# Patient Record
Sex: Male | Born: 1950 | Race: Black or African American | Hispanic: No | Marital: Single | Smoking: Current every day smoker
Health system: Southern US, Community
[De-identification: ages and names within clinical notes are randomized; demographics above are authoritative.]

## PROBLEM LIST (undated history)

## (undated) DIAGNOSIS — M1990 Unspecified osteoarthritis, unspecified site: Secondary | ICD-10-CM

## (undated) DIAGNOSIS — K635 Polyp of colon: Secondary | ICD-10-CM

## (undated) DIAGNOSIS — I10 Essential (primary) hypertension: Secondary | ICD-10-CM

## (undated) DIAGNOSIS — G8929 Other chronic pain: Secondary | ICD-10-CM

## (undated) DIAGNOSIS — E669 Obesity, unspecified: Secondary | ICD-10-CM

## (undated) DIAGNOSIS — Z8619 Personal history of other infectious and parasitic diseases: Secondary | ICD-10-CM

## (undated) DIAGNOSIS — F419 Anxiety disorder, unspecified: Secondary | ICD-10-CM

## (undated) DIAGNOSIS — Z8719 Personal history of other diseases of the digestive system: Secondary | ICD-10-CM

## (undated) DIAGNOSIS — F319 Bipolar disorder, unspecified: Secondary | ICD-10-CM

## (undated) DIAGNOSIS — N189 Chronic kidney disease, unspecified: Secondary | ICD-10-CM

## (undated) DIAGNOSIS — N419 Inflammatory disease of prostate, unspecified: Secondary | ICD-10-CM

## (undated) DIAGNOSIS — F339 Major depressive disorder, recurrent, unspecified: Secondary | ICD-10-CM

## (undated) DIAGNOSIS — F329 Major depressive disorder, single episode, unspecified: Secondary | ICD-10-CM

## (undated) DIAGNOSIS — N183 Chronic kidney disease, stage 3 unspecified: Secondary | ICD-10-CM

## (undated) DIAGNOSIS — N401 Enlarged prostate with lower urinary tract symptoms: Secondary | ICD-10-CM

## (undated) DIAGNOSIS — K219 Gastro-esophageal reflux disease without esophagitis: Secondary | ICD-10-CM

## (undated) DIAGNOSIS — R12 Heartburn: Secondary | ICD-10-CM

## (undated) HISTORY — DX: Bipolar disorder, unspecified: F31.9

## (undated) HISTORY — PX: APPENDECTOMY: SHX54

## (undated) HISTORY — DX: Inflammatory disease of prostate, unspecified: N41.9

## (undated) HISTORY — DX: Chronic kidney disease, stage 3 unspecified: N18.30

## (undated) HISTORY — PX: TRIGGER FINGER RELEASE: SHX641

## (undated) HISTORY — DX: Anxiety disorder, unspecified: F41.9

## (undated) HISTORY — DX: Chronic kidney disease, unspecified: N18.9

## (undated) HISTORY — DX: Unspecified osteoarthritis, unspecified site: M19.90

## (undated) HISTORY — PX: CHOLECYSTECTOMY: SHX55

## (undated) HISTORY — DX: Personal history of other infectious and parasitic diseases: Z86.19

## (undated) HISTORY — DX: Polyp of colon: K63.5

## (undated) HISTORY — DX: Other chronic pain: G89.29

## (undated) HISTORY — DX: Obesity, unspecified: E66.9

## (undated) HISTORY — DX: Major depressive disorder, single episode, unspecified: F32.9

## (undated) HISTORY — DX: Personal history of other diseases of the digestive system: Z87.19

## (undated) HISTORY — DX: Major depressive disorder, recurrent, unspecified: F33.9

## (undated) HISTORY — PX: OTHER SURGICAL HISTORY: SHX169

## (undated) HISTORY — DX: Essential (primary) hypertension: I10

## (undated) HISTORY — DX: Heartburn: R12

## (undated) HISTORY — DX: Benign prostatic hyperplasia with lower urinary tract symptoms: N40.1

## (undated) HISTORY — DX: Gastro-esophageal reflux disease without esophagitis: K21.9

---

## 2008-11-24 ENCOUNTER — Emergency Department: Payer: Self-pay | Admitting: Internal Medicine

## 2010-11-25 DIAGNOSIS — F17200 Nicotine dependence, unspecified, uncomplicated: Secondary | ICD-10-CM | POA: Insufficient documentation

## 2010-11-25 DIAGNOSIS — Z720 Tobacco use: Secondary | ICD-10-CM | POA: Insufficient documentation

## 2010-11-25 DIAGNOSIS — K219 Gastro-esophageal reflux disease without esophagitis: Secondary | ICD-10-CM | POA: Insufficient documentation

## 2010-11-25 DIAGNOSIS — I10 Essential (primary) hypertension: Secondary | ICD-10-CM | POA: Insufficient documentation

## 2010-11-25 DIAGNOSIS — F411 Generalized anxiety disorder: Secondary | ICD-10-CM | POA: Insufficient documentation

## 2010-11-25 DIAGNOSIS — N400 Enlarged prostate without lower urinary tract symptoms: Secondary | ICD-10-CM | POA: Insufficient documentation

## 2010-11-25 DIAGNOSIS — F329 Major depressive disorder, single episode, unspecified: Secondary | ICD-10-CM | POA: Insufficient documentation

## 2010-11-25 DIAGNOSIS — M1990 Unspecified osteoarthritis, unspecified site: Secondary | ICD-10-CM | POA: Insufficient documentation

## 2011-04-16 DIAGNOSIS — N289 Disorder of kidney and ureter, unspecified: Secondary | ICD-10-CM | POA: Insufficient documentation

## 2011-10-29 ENCOUNTER — Ambulatory Visit: Payer: Self-pay | Admitting: General Surgery

## 2011-11-25 ENCOUNTER — Ambulatory Visit: Payer: Self-pay | Admitting: General Surgery

## 2011-11-26 LAB — PATHOLOGY REPORT

## 2012-04-14 DIAGNOSIS — F319 Bipolar disorder, unspecified: Secondary | ICD-10-CM | POA: Insufficient documentation

## 2012-05-09 DIAGNOSIS — M549 Dorsalgia, unspecified: Secondary | ICD-10-CM | POA: Insufficient documentation

## 2012-05-09 DIAGNOSIS — L84 Corns and callosities: Secondary | ICD-10-CM | POA: Insufficient documentation

## 2012-07-18 DIAGNOSIS — E876 Hypokalemia: Secondary | ICD-10-CM | POA: Insufficient documentation

## 2012-09-23 DIAGNOSIS — R892 Abnormal level of other drugs, medicaments and biological substances in specimens from other organs, systems and tissues: Secondary | ICD-10-CM | POA: Insufficient documentation

## 2012-12-28 ENCOUNTER — Ambulatory Visit: Payer: Self-pay | Admitting: Pain Medicine

## 2013-01-16 ENCOUNTER — Ambulatory Visit: Payer: Self-pay | Admitting: Surgery

## 2013-01-23 ENCOUNTER — Ambulatory Visit: Payer: Self-pay | Admitting: Surgery

## 2013-01-24 LAB — PATHOLOGY REPORT

## 2013-05-30 ENCOUNTER — Ambulatory Visit: Payer: Self-pay | Admitting: Gastroenterology

## 2013-06-14 DIAGNOSIS — M7918 Myalgia, other site: Secondary | ICD-10-CM | POA: Insufficient documentation

## 2013-06-14 DIAGNOSIS — M47816 Spondylosis without myelopathy or radiculopathy, lumbar region: Secondary | ICD-10-CM | POA: Insufficient documentation

## 2013-07-17 DIAGNOSIS — K2270 Barrett's esophagus without dysplasia: Secondary | ICD-10-CM | POA: Insufficient documentation

## 2013-07-24 DIAGNOSIS — M549 Dorsalgia, unspecified: Secondary | ICD-10-CM

## 2013-07-24 DIAGNOSIS — G8929 Other chronic pain: Secondary | ICD-10-CM | POA: Insufficient documentation

## 2013-08-14 ENCOUNTER — Ambulatory Visit: Payer: Self-pay | Admitting: Physician Assistant

## 2013-09-18 DIAGNOSIS — R1084 Generalized abdominal pain: Secondary | ICD-10-CM | POA: Insufficient documentation

## 2013-10-07 ENCOUNTER — Ambulatory Visit: Payer: Self-pay | Admitting: Gastroenterology

## 2013-10-13 ENCOUNTER — Ambulatory Visit: Payer: Self-pay | Admitting: Gastroenterology

## 2013-10-16 ENCOUNTER — Ambulatory Visit: Payer: Self-pay | Admitting: Emergency Medicine

## 2013-11-19 ENCOUNTER — Emergency Department: Payer: Self-pay | Admitting: Emergency Medicine

## 2013-11-20 LAB — CBC
HCT: 42.8 % (ref 40.0–52.0)
HGB: 14.5 g/dL (ref 13.0–18.0)
MCH: 29.6 pg (ref 26.0–34.0)
MCHC: 33.8 g/dL (ref 32.0–36.0)
MCV: 88 fL (ref 80–100)
Platelet: 182 10*3/uL (ref 150–440)
RBC: 4.89 10*6/uL (ref 4.40–5.90)
RDW: 14 % (ref 11.5–14.5)
WBC: 10.2 10*3/uL (ref 3.8–10.6)

## 2013-11-20 LAB — BASIC METABOLIC PANEL
ANION GAP: 12 (ref 7–16)
BUN: 24 mg/dL — ABNORMAL HIGH (ref 7–18)
CALCIUM: 8.8 mg/dL (ref 8.5–10.1)
CO2: 25 mmol/L (ref 21–32)
Chloride: 103 mmol/L (ref 98–107)
Creatinine: 2.15 mg/dL — ABNORMAL HIGH (ref 0.60–1.30)
EGFR (Non-African Amer.): 32 — ABNORMAL LOW
GFR CALC AF AMER: 37 — AB
Glucose: 140 mg/dL — ABNORMAL HIGH (ref 65–99)
Osmolality: 286 (ref 275–301)
Potassium: 3.1 mmol/L — ABNORMAL LOW (ref 3.5–5.1)
SODIUM: 140 mmol/L (ref 136–145)

## 2013-11-20 LAB — TROPONIN I: Troponin-I: <0.02 ng/mL

## 2014-01-18 DIAGNOSIS — N401 Enlarged prostate with lower urinary tract symptoms: Secondary | ICD-10-CM

## 2014-01-18 DIAGNOSIS — N138 Other obstructive and reflux uropathy: Secondary | ICD-10-CM | POA: Insufficient documentation

## 2014-02-15 ENCOUNTER — Ambulatory Visit: Payer: Self-pay | Admitting: Emergency Medicine

## 2014-03-21 DIAGNOSIS — R768 Other specified abnormal immunological findings in serum: Secondary | ICD-10-CM | POA: Insufficient documentation

## 2014-03-27 ENCOUNTER — Ambulatory Visit: Payer: Self-pay | Admitting: Family Medicine

## 2014-04-19 ENCOUNTER — Ambulatory Visit: Payer: Self-pay | Admitting: Gastroenterology

## 2014-08-24 NOTE — Op Note (Signed)
PATIENT NAME:  [name] Gray, [name] MR#:  [id number] DATE OF BIRTH:  07-27-1950  DATE OF PROCEDURE:  01/23/2013  PREOPERATIVE DIAGNOSIS:  Chronic cholecystitis, cholelithiasis.   POSTOPERATIVE DIAGNOSIS:  Chronic cholecystitis, cholelithiasis.   PROCEDURE: Laparoscopic cholecystectomy, cholangiogram.   SURGEON: [name] Gray, M.D.   ANESTHESIA: General.   INDICATIONS: This 64 year old male has a history of epigastric pains, ultrasound findings of solitary gallstone and surgery was recommended for definitive treatment.   The patient was placed on the operating table in the supine position under general endotracheal anesthesia. The abdomen was prepared with ChloraPrep, and draped in a sterile manner.   A short incision was made in the inferior aspect of the umbilicus and carried down to the deep fascia which was grasped with laryngeal hook and elevated. A Veress needle was inserted, aspirated and irrigated with a saline solution. Next, the peritoneal cavity was inflated with carbon dioxide. The Veress needle was removed. The 10 mm cannula was inserted. The 10 mm 0-degree laparoscope was inserted to view the peritoneal cavity. Another incision was made in the epigastrium slightly to the right of the midline to introduce an 11 mm cannula. Two incisions were made in the lateral aspect of the right upper quadrant to introduce two 5 mm cannulas.   The gallbladder was found to have multiple adhesions around it. These multiple adhesions were taken down with blunt and sharp dissection. Several tiny bleeding points were electrocauterized. The gallbladder was retracted towards the right shoulder. The liver appeared normal. The porta hepatis was demonstrated. Additional adhesions were dissected. The cystic duct was dissected free from surrounding structures. The gallbladder was further mobilized with incision of the visceral peritoneum and dissection of adhesions. The cystic artery was dissected free from surrounding  structures. A critical view of safety was demonstrated. There was a small branch of the cystic artery which was controlled with endoclip and divided. This allowed better traction on the cystic duct. An endoclip was placed across the cystic duct adjacent to the neck of the gallbladder. An incision was made in the cystic duct to introduce a Reddick catheter. Half-strength Conray-60 dye was injected as the cholangiogram was done with fluoroscopy viewing the biliary tree and prompt flow of dye into the duodenum. No retained stones were seen. The Reddick catheter was removed. The cystic duct was doubly ligated with endoclips and divided. The cystic artery was controlled with endoclips and divided. The gallbladder was dissected free from the liver with blunt and sharp dissection. Several small bleeding points were cauterized. The site was irrigated with heparinized saline solution and aspirated. Hemostasis was intact. The gallbladder was delivered up through the infraumbilical incision, opened and suctioned. There was a large, hard, smooth stone and some white bile which was aspirated. The skin incision was lengthened by approximately 8 mm and the fascial incision was lengthened by some 8 mm, and with additional traction, the gallbladder with the stone was removed and submitted in formalin for routine pathology. The right upper quadrant was further inspected. Hemostasis was intact. The cannulas were removed. Carbon dioxide was allowed to escape from the peritoneal cavity. The fascial defect at the umbilicus was closed with 0 Vicryl figure-of-eight suture. Skin incisions were closed with interrupted 5-0 chromic subcuticular sutures, benzoin and Steri-Strips. Dressings were applied with paper tape. The patient tolerated surgery satisfactorily and was then prepared for transfer to the recovery room.     ____________________________ Shela CommonsJ. [name] RollsWilton Smith, MD jws:dmm D: 01/23/2013 09:18:16 ET T: 01/23/2013 09:43:40  ET JOB#: [id number]  cc: Adella Hare, MD, <Dictator> Adella Hare MD ELECTRONICALLY SIGNED 01/24/2013 9:01

## 2014-08-27 LAB — SURGICAL PATHOLOGY

## 2014-12-17 ENCOUNTER — Other Ambulatory Visit: Payer: Self-pay | Admitting: Family Medicine

## 2014-12-18 ENCOUNTER — Other Ambulatory Visit: Payer: Self-pay | Admitting: Family Medicine

## 2014-12-18 DIAGNOSIS — R1031 Right lower quadrant pain: Secondary | ICD-10-CM

## 2014-12-18 DIAGNOSIS — K4090 Unilateral inguinal hernia, without obstruction or gangrene, not specified as recurrent: Secondary | ICD-10-CM

## 2014-12-21 ENCOUNTER — Ambulatory Visit
Admission: RE | Admit: 2014-12-21 | Discharge: 2014-12-21 | Disposition: A | Discharge status: Home / Self Care | Payer: Medicare Other | Source: Ambulatory Visit | Attending: Family Medicine | Admitting: Family Medicine

## 2014-12-21 ENCOUNTER — Ambulatory Visit: Admission: RE | Admit: 2014-12-21 | Payer: Medicare Other | Source: Ambulatory Visit

## 2014-12-21 DIAGNOSIS — K4090 Unilateral inguinal hernia, without obstruction or gangrene, not specified as recurrent: Secondary | ICD-10-CM | POA: Insufficient documentation

## 2014-12-21 DIAGNOSIS — R1031 Right lower quadrant pain: Secondary | ICD-10-CM

## 2015-04-18 ENCOUNTER — Ambulatory Visit: Payer: Self-pay | Admitting: Urology

## 2015-04-24 ENCOUNTER — Ambulatory Visit (INDEPENDENT_AMBULATORY_CARE_PROVIDER_SITE_OTHER): Payer: Medicare Other | Admitting: Urology

## 2015-04-24 ENCOUNTER — Encounter: Payer: Self-pay | Admitting: *Deleted

## 2015-04-24 ENCOUNTER — Encounter: Payer: Self-pay | Admitting: Urology

## 2015-04-24 VITALS — BP 126/75 | HR 67 | Ht 69.0 in | Wt 217.3 lb

## 2015-04-24 DIAGNOSIS — N401 Enlarged prostate with lower urinary tract symptoms: Secondary | ICD-10-CM | POA: Diagnosis not present

## 2015-04-24 DIAGNOSIS — Z87448 Personal history of other diseases of urinary system: Secondary | ICD-10-CM | POA: Diagnosis not present

## 2015-04-24 DIAGNOSIS — Z87898 Personal history of other specified conditions: Secondary | ICD-10-CM | POA: Insufficient documentation

## 2015-04-24 DIAGNOSIS — N138 Other obstructive and reflux uropathy: Secondary | ICD-10-CM

## 2015-04-24 LAB — URINALYSIS, COMPLETE
Bilirubin, UA: NEGATIVE
GLUCOSE, UA: NEGATIVE
Ketones, UA: NEGATIVE
NITRITE UA: NEGATIVE
PH UA: 5.5 (ref 5.0–7.5)
Specific Gravity, UA: 1.015 (ref 1.005–1.030)
UUROB: 0.2 mg/dL (ref 0.2–1.0)

## 2015-04-24 LAB — MICROSCOPIC EXAMINATION: RBC MICROSCOPIC, UA: NONE SEEN /HPF (ref 0–?)

## 2015-04-24 LAB — BLADDER SCAN AMB NON-IMAGING: SCAN RESULT: 44

## 2015-04-24 MED ORDER — DOXAZOSIN MESYLATE 4 MG PO TABS: 4.0000 mg | ORAL_TABLET | Freq: Every day | ORAL | Status: DC

## 2015-04-24 NOTE — Progress Notes (Signed)
04/24/2015 2:44 PM   [name] Gray Dec 17, 1950 [id number]  Referring provider: Rayetta Humphrey, MD [address] [place], [place] [address]  Chief Complaint  Patient presents with  . Prostatitis    referred by [facility] Dr. Greggory Stallion    HPI: Patient is a 64 year old African-American male with a history of prostatitis who is referred by his primary care physician, Dr. Greggory Stallion, in anticipation for a hernia surgery.   Patient has a history of post anesthesia urinary retention.  His IPSS score today is 29, which is severe lower urinary tract symptomatology. He feels terrible with his quality life due to his urinary symptoms. His PVR is 44 mL.  His major complaint today is urinary frequency, urgency, dysuria, nocturia, post-void dribbling, intermittency, hesitancy and straining to urinate.    He has had these symptoms for over two years.  He denies any dysuria, hematuria or suprapubic pain.   He had been on tamsulosin in the past and he believes it helped his urinary symptoms.  He also denies any recent fevers, chills, nausea or vomiting.   He does not have a family history of PCa.      IPSS      04/24/15 1400       International Prostate Symptom Score   How often have you had the sensation of not emptying your bladder? More than half the time     How often have you had to urinate less than every two hours? More than half the time     How often have you found you stopped and started again several times when you urinated? About half the time     How often have you found it difficult to postpone urination? Almost always     How often have you had a weak urinary stream? Almost always     How often have you had to strain to start urination? More than half the time     How many times did you typically get up at night to urinate? 4 Times     Total IPSS Score 29     Quality of Life due to urinary symptoms   If you were to spend the rest of your life with your urinary condition  just the way it is now how would you feel about that? Terrible        Score:  1-7 Mild 8-19 Moderate 20-35 Severe  He has not experienced gross hematuria or suprapubic pain. He is not having fevers, chills, nausea or vomiting. He has not seen a urologist in the past he has not had previous PSA's.  He is scheduled for herniorrhaphy in the future, but he has a history of postoperative urinary retention.     PMH: Past Medical History  Diagnosis Date  . Chronic kidney disease   . Prostatitis   . HTN (hypertension)   . Heart burn   . Depression     Surgical History: Past Surgical History  Procedure Laterality Date  . Appendectomy    . Gall bladdder      Home Medications:    Medication List       This list is accurate as of: 04/24/15  2:44 PM.  Always use your most recent med list.               amLODipine 10 MG tablet  Commonly known as:  NORVASC  Take 10 mg by mouth.     citalopram 40 MG tablet  Commonly known as:  CELEXA  Take 40 mg by mouth.     doxazosin 4 MG tablet  Commonly known as:  CARDURA  Take 1 tablet (4 mg total) by mouth daily.     hydrochlorothiazide 25 MG tablet  Commonly known as:  HYDRODIURIL  Take 25 mg by mouth.     lamoTRIgine 100 MG tablet  Commonly known as:  LAMICTAL  Take 100 mg by mouth.     lisinopril 40 MG tablet  Commonly known as:  PRINIVIL,ZESTRIL  Take 40 mg by mouth.     metoprolol 50 MG tablet  Commonly known as:  LOPRESSOR  Take by mouth.     MULTI-VITAMINS Tabs  Take by mouth.     oxyCODONE 5 MG immediate release tablet  Commonly known as:  Oxy IR/ROXICODONE  Take by mouth.        Allergies:  Allergies  Allergen Reactions  . Antihistamines, Chlorpheniramine-Type     Cannot take due to chronic kidney disease    Family History: Family History  Problem Relation Age of Onset  . Kidney disease Neg Hx   . Prostate cancer Neg Hx     Social History:  reports that he has been smoking.  He does not have  any smokeless tobacco history on file. He reports that he does not drink alcohol or use illicit drugs.  ROS: UROLOGY Frequent Urination?: Yes Hard to postpone urination?: Yes Burning/pain with urination?: Yes Get up at night to urinate?: Yes Leakage of urine?: Yes Urine stream starts and stops?: Yes Trouble starting stream?: Yes Do you have to strain to urinate?: Yes Blood in urine?: No Urinary tract infection?: No Sexually transmitted disease?: No Injury to kidneys or bladder?: No Painful intercourse?: No Weak stream?: No Erection problems?: Yes Penile pain?: No  Gastrointestinal Nausea?: No Vomiting?: No Indigestion/heartburn?: No Diarrhea?: Yes Constipation?: Yes  Constitutional Fever: No Night sweats?: No Weight loss?: No Fatigue?: No  Skin Skin rash/lesions?: No Itching?: No  Eyes Blurred vision?: No Double vision?: No  Ears/Nose/Throat Sore throat?: No Sinus problems?: No  Hematologic/Lymphatic Swollen glands?: No Easy bruising?: No  Cardiovascular Leg swelling?: No Chest pain?: No  Respiratory Cough?: No Shortness of breath?: No  Endocrine Excessive thirst?: No  Musculoskeletal Back pain?: Yes Joint pain?: No  Neurological Headaches?: No Dizziness?: No  Psychologic Depression?: Yes Anxiety?: No  Physical Exam: BP 126/75 mmHg  Pulse 67  Ht 5\' 9"  (1.753 m)  Wt 217 lb 4.8 oz (98.567 kg)  BMI 32.08 kg/m2  Constitutional: Well nourished. Alert and oriented, No acute distress. HEENT: [place] AT, moist mucus membranes. Trachea midline, no masses. Cardiovascular: No clubbing, cyanosis, or edema. Respiratory: Normal respiratory effort, no increased work of breathing. GI: Abdomen is soft, non tender, non distended, no abdominal masses. Liver and spleen not palpable.  No hernias appreciated.  Stool sample for occult testing is not indicated.   GU: No CVA tenderness.  No bladder fullness or masses.  Patient with circumcise phallus.    Urethral meatus is patent.  No penile discharge. No penile lesions or rashes. Scrotum without lesions, cysts, rashes and/or edema.  Testicles are located scrotally bilaterally. No masses are appreciated in the testicles. Left and right epididymis are normal. Rectal: Patient with  normal sphincter tone. Anus and perineum without scarring or rashes. No rectal masses are appreciated. Prostate is approximately 45 grams, no nodules are appreciated. Seminal vesicles are normal. Skin: No rashes, bruises or suspicious lesions. Lymph: No cervical or inguinal adenopathy.  Neurologic: Grossly intact, no focal deficits, moving all 4 extremities. Psychiatric: Normal mood and affect.  Laboratory Data: Lab Results  Component Value Date   WBC 10.2 11/20/2013   HGB 14.5 11/20/2013   HCT 42.8 11/20/2013   MCV 88 11/20/2013   PLT 182 11/20/2013    Lab Results  Component Value Date   CREATININE 2.15* 11/20/2013    Urinalysis Results for orders placed or performed in visit on 04/24/15  Microscopic Examination  Result Value Ref Range   WBC, UA >30 (H) 0 -  5 /hpf   RBC, UA None seen 0 -  2 /hpf   Epithelial Cells (non renal) 0-10 0 - 10 /hpf   Mucus, UA Present (A) Not Estab.   Bacteria, UA Few (A) None seen/Few  PSA  Result Value Ref Range   Prostate Specific Ag, Serum 1.1 0.0 - 4.0 ng/mL  Urinalysis, Complete  Result Value Ref Range   Specific Gravity, UA 1.015 1.005 - 1.030   pH, UA 5.5 5.0 - 7.5   Color, UA Yellow Yellow   Appearance Ur Cloudy (A) Clear   Leukocytes, UA 2+ (A) Negative   Protein, UA 1+ (A) Negative/Trace   Glucose, UA Negative Negative   Ketones, UA Negative Negative   RBC, UA Trace (A) Negative   Bilirubin, UA Negative Negative   Urobilinogen, Ur 0.2 0.2 - 1.0 mg/dL   Nitrite, UA Negative Negative   Microscopic Examination See below:   BLADDER SCAN AMB NON-IMAGING  Result Value Ref Range   Scan Result 44     Pertinent Imaging: Results for MONTREAL, STEIDLE (MRN  [id number]) as of 04/24/2015 14:31  Ref. Range 04/24/2015 14:08  Scan Result Unknown 44    Assessment & Plan:    1. BPH with LUTS:   I PSS score is 29/6. His PVR is 44 mL.  He may have had success with an alpha-blocker in the past with his urinary symptoms. His insurance requires a prior authorization for tamsulosin, so I will prescribe doxazosin 4 mg to take at night. He'll follow-up in 2 weeks' time for I PSS score and PVR.  - PSA - Urinalysis, Complete - BLADDER SCAN AMB NON-IMAGING  2. History of postoperative urinary retention:   Patient's prostate was not large on exam and his PVR is minimal. His postoperative retention most likely is not due to his BPH but rather the anesthesia interacting with bladder function alone.  He will be started on a alpha-blocker in hopes to improve his chances of not going into urinary retention after his hernia surgery.  I did warn the patient that even if his urinary symptoms improve, he still may run the risk of postoperative retention requiring a catheter placed after surgery.   Return in about 2 weeks (around 05/08/2015) for PVR and IPSS score.  Michiel Cowboy, PA-C  [facility] [address] [place], [place] [address] [phone]

## 2015-04-25 ENCOUNTER — Telehealth: Payer: Self-pay

## 2015-04-25 LAB — PSA: Prostate Specific Ag, Serum: 1.1 ng/mL (ref 0.0–4.0)

## 2015-04-25 NOTE — Telephone Encounter (Signed)
Spoke with pt in reference to PSA. Pt voiced understanding.  

## 2015-04-25 NOTE — Telephone Encounter (Signed)
-----   Message from Harle BattiestShannon A McGowan, PA-C sent at 04/25/2015  8:27 AM EST ----- Patient's PSA is normal.  It should be repeated in one year.  He has a follow up appointment in 2 weeks.  We will see him then.

## 2015-05-10 ENCOUNTER — Ambulatory Visit: Payer: Medicare Other | Admitting: Urology

## 2015-05-10 ENCOUNTER — Encounter: Payer: Self-pay | Admitting: Urology

## 2015-06-06 ENCOUNTER — Encounter: Payer: Self-pay | Admitting: Urology

## 2015-06-06 ENCOUNTER — Ambulatory Visit: Payer: Medicare Other | Admitting: Urology

## 2016-01-13 ENCOUNTER — Emergency Department: Payer: Medicare Other

## 2016-01-13 ENCOUNTER — Emergency Department
Admission: EM | Admit: 2016-01-13 | Discharge: 2016-01-13 | Disposition: A | Discharge status: Home / Self Care | Payer: Medicare Other | Attending: Emergency Medicine | Admitting: Emergency Medicine

## 2016-01-13 DIAGNOSIS — N189 Chronic kidney disease, unspecified: Secondary | ICD-10-CM | POA: Insufficient documentation

## 2016-01-13 DIAGNOSIS — Z79899 Other long term (current) drug therapy: Secondary | ICD-10-CM | POA: Insufficient documentation

## 2016-01-13 DIAGNOSIS — M5442 Lumbago with sciatica, left side: Secondary | ICD-10-CM | POA: Diagnosis not present

## 2016-01-13 DIAGNOSIS — F17200 Nicotine dependence, unspecified, uncomplicated: Secondary | ICD-10-CM | POA: Diagnosis not present

## 2016-01-13 DIAGNOSIS — I129 Hypertensive chronic kidney disease with stage 1 through stage 4 chronic kidney disease, or unspecified chronic kidney disease: Secondary | ICD-10-CM | POA: Diagnosis not present

## 2016-01-13 DIAGNOSIS — M25552 Pain in left hip: Secondary | ICD-10-CM | POA: Diagnosis present

## 2016-01-13 DIAGNOSIS — M5432 Sciatica, left side: Secondary | ICD-10-CM

## 2016-01-13 MED ORDER — TRAMADOL HCL 50 MG PO TABS: 50.0000 mg | ORAL_TABLET | Freq: Four times a day (QID) | ORAL | 0 refills | Status: DC | PRN

## 2016-01-13 MED ORDER — MORPHINE SULFATE (PF) 4 MG/ML IV SOLN
4.0000 mg | Freq: Once | INTRAVENOUS | Status: AC
  Administered 2016-01-13: 4 mg via INTRAMUSCULAR
  Filled 2016-01-13: qty 1

## 2016-01-13 MED ORDER — ETODOLAC 200 MG PO CAPS: 200.0000 mg | ORAL_CAPSULE | Freq: Three times a day (TID) | ORAL | 0 refills | Status: DC

## 2016-01-13 MED ORDER — KETOROLAC TROMETHAMINE 30 MG/ML IJ SOLN
30.0000 mg | Freq: Once | INTRAMUSCULAR | Status: DC
  Filled 2016-01-13: qty 1

## 2016-01-13 MED ORDER — LIDOCAINE 5 % EX PTCH
1.0000 | MEDICATED_PATCH | CUTANEOUS | Status: DC
  Administered 2016-01-13: 1 via TRANSDERMAL
  Filled 2016-01-13: qty 1

## 2016-01-13 MED ORDER — LIDOCAINE 5 % EX PTCH: 1.0000 | MEDICATED_PATCH | Freq: Two times a day (BID) | CUTANEOUS | 0 refills | Status: DC

## 2016-01-13 MED ORDER — OXYCODONE-ACETAMINOPHEN 5-325 MG PO TABS
1.0000 | ORAL_TABLET | Freq: Once | ORAL | Status: AC
Start: 2016-01-13 — End: 2016-01-13
  Administered 2016-01-13: 1 via ORAL

## 2016-01-13 MED ORDER — KETOROLAC TROMETHAMINE 30 MG/ML IJ SOLN
30.0000 mg | Freq: Once | INTRAMUSCULAR | Status: AC
  Administered 2016-01-13: 30 mg via INTRAMUSCULAR

## 2016-01-13 MED ORDER — OXYCODONE-ACETAMINOPHEN 5-325 MG PO TABS
ORAL_TABLET | ORAL | Status: AC
  Administered 2016-01-13: 1 via ORAL
  Filled 2016-01-13: qty 1

## 2016-01-13 NOTE — ED Notes (Signed)
Reviewed d/c instructions, follow-up care and prescriptions with pt. Pt verbalized understanding 

## 2016-01-13 NOTE — ED Provider Notes (Signed)
[facility] Emergency Department Provider Note   ____________________________________________   First MD Initiated Contact with Patient 01/13/16 [phone]     (approximate)  I have reviewed the triage vital signs and the nursing notes.   HISTORY  Chief Complaint Hip Pain    HPI [name] Gray is a 65 y.o. male who comes into the hospital today with some back pain. The patient reports he fell out of the shower last week Wednesday and fell on his bottom. He reports he did up for a few years ago and was told he had sciatica. He reports the pain was initially a numb pain but it's been getting worse and worse. He reports he can't take the pain anymore. The pain is greater on the left more so than the right. It seems to go down the back of his thigh and he is having pain in his muscle. He reports that he got up here to use the bathroom and the pain seemed to be getting worse and shooting down his leg and into his lower back. The patient rates his pain an 8 out of 10 in intensity. He reports it is worse when he walks on it and he feels like he can't stand. He reports that he is not taking anything for pain at home because of the blood pressure. The patient was concerned so decided to come in to the hospital for evaluation.   Past Medical History:  Diagnosis Date  . Chronic kidney disease   . Depression   . Heart burn   . HTN (hypertension)   . Prostatitis     Patient Active Problem List   Diagnosis Date Noted  . BPH with obstruction/lower urinary tract symptoms 04/24/2015  . H/O urinary retention 04/24/2015  . 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 Benign prostatic hyperplasia with urinary obstruction 01/18/2014  . Abdominal pain, generalized 09/18/2013  . Back pain, chronic 07/24/2013  . Barrett esophagus 07/17/2013  . 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 Myofascial pain 06/14/2013  . Abnormal toxicological findings 09/23/2012  . Decreased  potassium in the blood 07/18/2012  . Back ache 05/09/2012  . Callus of foot 05/09/2012  . Affective bipolar disorder (HCC) 04/14/2012  . Bipolar affective disorder (HCC) 04/14/2012  . Impaired renal function 04/16/2011  . Anxiety state 11/25/2010  . Clinical depression 11/25/2010  . Acid reflux 11/25/2010  . Benign essential HTN 11/25/2010  . Benign prostatic hypertrophy without urinary obstruction 11/25/2010  . Arthritis, degenerative 11/25/2010  . Compulsive tobacco user syndrome 11/25/2010  . Current tobacco use 11/25/2010    Past Surgical History:  Procedure Laterality Date  . APPENDECTOMY    . gall bladdder      Prior to Admission medications   Medication Sig Start Date End Date Taking? Authorizing Provider  amLODipine (NORVASC) 10 MG tablet Take 10 mg by mouth.    Historical Provider, MD  citalopram (CELEXA) 40 MG tablet Take 40 mg by mouth.    Historical Provider, MD  doxazosin (CARDURA) 4 MG tablet Take 1 tablet (4 mg total) by mouth daily. 04/24/15   Harle Battiest, PA-C  etodolac (LODINE) 200 MG capsule Take 1 capsule (200 mg total) by mouth every 8 (eight) hours. 01/13/16   Rebecka Apley, MD  hydrochlorothiazide (HYDRODIURIL) 25 MG tablet Take 25 mg by mouth.    Historical Provider, MD  lamoTRIgine (LAMICTAL) 100 MG tablet Take 100 mg by mouth.    Historical Provider, MD  lidocaine (LIDODERM)  5 % Place 1 patch onto the skin every 12 (twelve) hours. Remove & Discard patch within 12 hours or as directed by MD 01/13/16 01/12/17  Rebecka Apley, MD  lisinopril (PRINIVIL,ZESTRIL) 40 MG tablet Take 40 mg by mouth.    Historical Provider, MD  metoprolol (LOPRESSOR) 50 MG tablet Take by mouth. 09/19/14 08/22/15  Historical Provider, MD  Multiple Vitamin (MULTI-VITAMINS) TABS Take by mouth.    Historical Provider, MD  oxyCODONE (OXY IR/ROXICODONE) 5 MG immediate release tablet Take by mouth. 05/24/14   Historical Provider, MD  traMADol (ULTRAM) 50 MG tablet Take 1 tablet (50  mg total) by mouth every 6 (six) hours as needed. 01/13/16   Rebecka Apley, MD    Allergies Antihistamines, chlorpheniramine-type  Family History  Problem Relation Age of Onset  . Kidney disease Neg Hx   . Prostate cancer Neg Hx     Social History Social History  Substance Use Topics  . Smoking status: Current Every Day Smoker  . Smokeless tobacco: Not on file  . Alcohol use No    Review of Systems Constitutional: No fever/chills Eyes: No visual changes. ENT: No sore throat. Cardiovascular: Denies chest pain. Respiratory: Denies shortness of breath. Gastrointestinal: No abdominal pain.  No nausea, no vomiting.  No diarrhea.  No constipation. Genitourinary: Negative for dysuria. Musculoskeletal:Low back pain and left SI joint pain Skin: Negative for rash. Neurological: Negative for headaches, focal weakness or numbness.  10-point ROS otherwise negative.  ____________________________________________   PHYSICAL EXAM:  VITAL SIGNS: ED Triage Vitals  Enc Vitals Group     BP 01/13/16 0009 (!) 155/95     Pulse Rate 01/13/16 0009 72     Resp 01/13/16 0009 (!) 22     Temp 01/13/16 0009 98.9 F (37.2 C)     Temp Source 01/13/16 0009 Oral     SpO2 01/13/16 0009 100 %     Weight 01/13/16 0010 210 lb (95.3 kg)     Height 01/13/16 0010 5\' 9"  (1.753 m)     Head Circumference --      Peak Flow --      Pain Score 01/13/16 0010 10     Pain Loc --      Pain Edu? --      Excl. in GC? --     Constitutional: Alert and oriented. Well appearing and in Moderate distress. Eyes: Conjunctivae are normal. PERRL. EOMI. Head: Atraumatic. Nose: No congestion/rhinnorhea. Mouth/Throat: Mucous membranes are moist.  Oropharynx non-erythematous. Cardiovascular: Normal rate, regular rhythm. Grossly normal heart sounds.  Good peripheral circulation. Respiratory: Normal respiratory effort.  No retractions. Lungs CTAB. Gastrointestinal: Soft and nontender. No distention. Positive bowel  sounds Musculoskeletal: Lower midline tenderness to palpation and left SI joint tenderness to palpation Neurologic:  Normal speech and language.  Skin:  Skin is warm, dry and intact.  Psychiatric: Mood and affect are normal.   ____________________________________________   LABS (all labs ordered are listed, but only abnormal results are displayed)  Labs Reviewed - No data to display ____________________________________________  EKG  None ____________________________________________  RADIOLOGY  Left hip x-ray with pelvis Pelvis CT ____________________________________________   PROCEDURES  Procedure(s) performed: None  Procedures  Critical Care performed: No  ____________________________________________   INITIAL IMPRESSION / ASSESSMENT AND PLAN / ED COURSE  Pertinent labs & imaging results that were available during my care of the patient were reviewed by me and considered in my medical decision making (see chart for details).  This is a 65 year old  male who comes into the hospital today after a fall almost a week ago with some pain to his left SI joint going down his leg. The patient reports that he has not been taking anything for pain but it seems to be worse when he stands or walks. The patient's x-ray was performed. I will give the patient a shot of morphine as well as Toradol and I will send him for a CT scan to evaluate for possible occult fracture. The patient will be reassessed once I received the results of his imaging studies.  Clinical Course  Value Comment By Time  DG Hip Unilat With Pelvis 2-3 Views Left No fracture or subluxation of the pelvis or left hip. Rebecka ApleyAllison P Delonna Ney, MD 09/11 [phone]  CT Pelvis Wo Contrast No significant abnormality. Rebecka ApleyAllison P Marcel Sorter, MD 09/11 0500    The patient's CT scan and x-ray are both unremarkable. The patient's pain is improved after the medication. I will give the patient some prescriptions for home and have her follow-up  with orthopedic surgery if he has any worsening or persistent pain. The patient will be discharged to home. ____________________________________________   FINAL CLINICAL IMPRESSION(S) / ED DIAGNOSES  Final diagnoses:  Left hip pain  Sciatica of left side  Hip pain, left      NEW MEDICATIONS STARTED DURING THIS VISIT:  New Prescriptions   ETODOLAC (LODINE) 200 MG CAPSULE    Take 1 capsule (200 mg total) by mouth every 8 (eight) hours.   LIDOCAINE (LIDODERM) 5 %    Place 1 patch onto the skin every 12 (twelve) hours. Remove & Discard patch within 12 hours or as directed by MD   TRAMADOL (ULTRAM) 50 MG TABLET    Take 1 tablet (50 mg total) by mouth every 6 (six) hours as needed.     Note:  This document was prepared using Dragon voice recognition software and may include unintentional dictation errors.    Rebecka ApleyAllison P Znya Albino, MD 01/13/16 [phone]

## 2016-01-13 NOTE — ED Notes (Signed)
Pt reports he fell last Wednesday. Pt c/o left hip/buttock pain radiating down leg. Pt reports hx of sciatica in same leg, however reports this pain is worse than normal

## 2016-01-13 NOTE — ED Notes (Signed)
Patient transported to CT 

## 2016-01-13 NOTE — ED Notes (Signed)
MD Webster at bedside 

## 2016-01-13 NOTE — ED Triage Notes (Signed)
Reports fell getting out of the shower a week ago.  Patient reports pain to left hip that radiates down into left leg.

## 2016-07-07 ENCOUNTER — Ambulatory Visit: Payer: Medicare Other | Admitting: Anesthesiology

## 2016-07-13 ENCOUNTER — Ambulatory Visit
Admission: EM | Admit: 2016-07-13 | Discharge: 2016-07-13 | Disposition: A | Discharge status: Home / Self Care | Payer: Medicare Other | Attending: Family Medicine | Admitting: Family Medicine

## 2016-07-13 DIAGNOSIS — S161XXA Strain of muscle, fascia and tendon at neck level, initial encounter: Secondary | ICD-10-CM | POA: Diagnosis not present

## 2016-07-13 DIAGNOSIS — H00011 Hordeolum externum right upper eyelid: Secondary | ICD-10-CM

## 2016-07-13 MED ORDER — ERYTHROMYCIN 5 MG/GM OP OINT: TOPICAL_OINTMENT | OPHTHALMIC | 0 refills | Status: DC

## 2016-07-13 MED ORDER — CARISOPRODOL 350 MG PO TABS: 350.0000 mg | ORAL_TABLET | Freq: Three times a day (TID) | ORAL | 0 refills | Status: DC

## 2016-07-13 NOTE — ED Provider Notes (Signed)
CSN: [id number]     Arrival date & time 07/13/16  1341 History   First MD Initiated Contact with Patient 07/13/16 1456     Chief Complaint  Patient presents with  . Conjunctivitis    right eye   (Consider location/radiation/quality/duration/timing/severity/associated sxs/prior Treatment) HPI  65 year old male who presents with a swollen right eye that has been losing white discharge for approximately 2 days. His eyes are matted in the mornings. The swelling is limited to his upper lid towards the lateral canthus. Vision changes. In addition his had a headache with some itching and tenderness to the touch. He does not wear contacts or glasses. He has noticed a decreased range of motion of his neck. Although his headache and pain seem to be all right sided. He has not noticed any rashes.        Past Medical History:  Diagnosis Date  . Chronic kidney disease   . Depression   . Heart burn   . HTN (hypertension)   . Prostatitis    Past Surgical History:  Procedure Laterality Date  . APPENDECTOMY    . gall bladdder     Family History  Problem Relation Age of Onset  . Kidney disease Neg Hx   . Prostate cancer Neg Hx    Social History  Substance Use Topics  . Smoking status: Current Every Day Smoker    Types: Cigarettes  . Smokeless tobacco: Never Used  . Alcohol use No    Review of Systems  Constitutional: Positive for activity change. Negative for chills, fatigue and fever.  Eyes: Positive for pain and discharge. Negative for photophobia, redness and visual disturbance.  Skin: Negative for rash.  All other systems reviewed and are negative.   Allergies  Antihistamines, chlorpheniramine-type  Home Medications   Prior to Admission medications   Medication Sig Start Date End Date Taking? Authorizing Provider  amLODipine (NORVASC) 10 MG tablet Take 10 mg by mouth.   Yes Historical Provider, MD  citalopram (CELEXA) 40 MG tablet Take 40 mg by mouth.   Yes Historical  Provider, MD  doxazosin (CARDURA) 4 MG tablet Take 1 tablet (4 mg total) by mouth daily. 04/24/15  Yes Shannon A McGowan, PA-C  etodolac (LODINE) 200 MG capsule Take 1 capsule (200 mg total) by mouth every 8 (eight) hours. 01/13/16  Yes Rebecka Apley, MD  hydrochlorothiazide (HYDRODIURIL) 25 MG tablet Take 25 mg by mouth.   Yes Historical Provider, MD  lamoTRIgine (LAMICTAL) 100 MG tablet Take 100 mg by mouth.   Yes Historical Provider, MD  lisinopril (PRINIVIL,ZESTRIL) 40 MG tablet Take 40 mg by mouth.   Yes Historical Provider, MD  Multiple Vitamin (MULTI-VITAMINS) TABS Take by mouth.   Yes Historical Provider, MD  carisoprodol (SOMA) 350 MG tablet Take 1 tablet (350 mg total) by mouth 3 (three) times daily. 07/13/16   Lutricia Feil, PA-C  erythromycin ophthalmic ointment Place a 1/2 inch ribbon of ointment into the lower eyelid. 07/13/16   Lutricia Feil, PA-C  metoprolol (LOPRESSOR) 50 MG tablet Take by mouth. 09/19/14 08/22/15  Historical Provider, MD   Meds Ordered and Administered this Visit  Medications - No data to display  BP (!) 147/89 (BP Location: Left Arm)   Pulse 61   Temp 98.7 F (37.1 C) (Oral)   Resp 18   Ht 5\' 10"  (1.778 m)   Wt 209 lb (94.8 kg)   SpO2 99%   BMI 29.99 kg/m  No data found.  Physical Exam  Constitutional: He is oriented to person, place, and time. He appears well-developed and well-nourished. No distress.  HENT:  Head: Normocephalic and atraumatic.  Right Ear: External ear normal.  Left Ear: External ear normal.  Nose: Nose normal.  Eyes: EOM are normal. Pupils are equal, round, and reactive to light. Right eye exhibits discharge. Left eye exhibits no discharge.  Exam nation the right eye shows a swelling of the right eyelid lateral aspect this is tender to the touch. EOMs are intact. Pupils are equal and reactive to light. Conjunctiva is clear. He does have a white discharge in the lateral canthus and stained over the skin immediate to the  lateral canthus. Examination of the head and scalp do not show any rashes present. Tender to palpation in the paraspinous muscles were there is palpable spasm on the right. The tissues are soft on the left. He does have a decreased range of motion particularly on rightward rotation and extension and flexion.  Neck:  Referred to eyes above  Pulmonary/Chest: Effort normal and breath sounds normal. No respiratory distress. He has no wheezes.  Musculoskeletal: Normal range of motion.  Lymphadenopathy:    He has no cervical adenopathy.  Neurological: He is alert and oriented to person, place, and time.  Skin: Skin is warm and dry. He is not diaphoretic.  Psychiatric: He has a normal mood and affect. His behavior is normal. Judgment and thought content normal.  Nursing note and vitals reviewed.   Urgent Care Course     Procedures (including critical care time)  Labs Review Labs Reviewed - No data to display  Imaging Review No results found.   Visual Acuity Review  Right Eye Distance:   Left Eye Distance:   Bilateral Distance:    Right Eye Near:   Left Eye Near:    Bilateral Near:         MDM   1. Hordeolum externum of right upper eyelid   2. Cervical myofascial strain, initial encounter    Discharge Medication List as of 07/13/2016  3:18 PM    START taking these medications   Details  carisoprodol (SOMA) 350 MG tablet Take 1 tablet (350 mg total) by mouth 3 (three) times daily., Starting Mon 07/13/2016, Print    erythromycin ophthalmic ointment Place a 1/2 inch ribbon of ointment into the lower eyelid., Normal      Plan: 1. Test/x-ray results and diagnosis reviewed with patient 2. rx as per orders; risks, benefits, potential side effects reviewed with patient 3. Recommend supportive treatment with Warm Compresses times daily followed by mild massage. Use erythromycin ointment as prescribed. Because of the limitations of his prescription plan I have given him Soma for  muscle muscle relaxer. Other Options were not covered by his plan. Other considerations of the differential could be possible early herpes zoster the present time is doubtful. He should follow-up with his primary care if he is not improving or if he worsens over the next couple of days. 4. F/u prn if symptoms worsen or don't improve     Lutricia FeilWilliam P Tanasia Budzinski, PA-C 07/13/16 1612

## 2016-07-13 NOTE — ED Triage Notes (Signed)
Pt c/o swollen right eye that is oozing white discharge for about 2 days. Denies any vision changes, but a little blurry. Headache and some itching and tender to the touch. No contacts or glasses

## 2017-03-01 ENCOUNTER — Encounter (INDEPENDENT_AMBULATORY_CARE_PROVIDER_SITE_OTHER): Payer: Self-pay | Admitting: Vascular Surgery

## 2017-11-16 IMAGING — CT CT PELVIS W/O CM
2 of 3 series · 16 of 46 positions shown, 18 images · non-contrast
Comparison: 10/07/2013

CLINICAL DATA: Left buttock pain after fall last week.

EXAM:
CT PELVIS WITHOUT CONTRAST
TECHNIQUE: Multidetector CT imaging of the pelvis was performed following the
standard protocol without intravenous contrast.

[Series 6: axial st · axial · 0.74mm/px · z∈[-391,-165]mm · 13 of 131 slices shown, 15 images]
[im 9/131  soft-tissue]
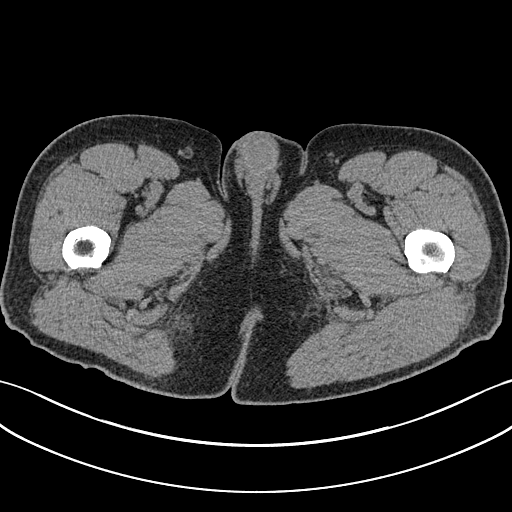
[im 9/131  bone]
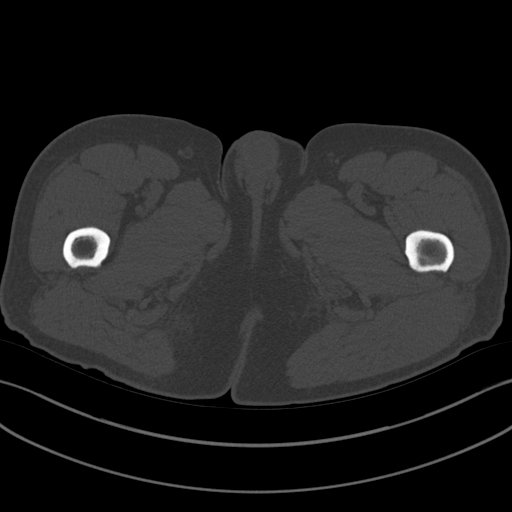
[im 17/131  soft-tissue]
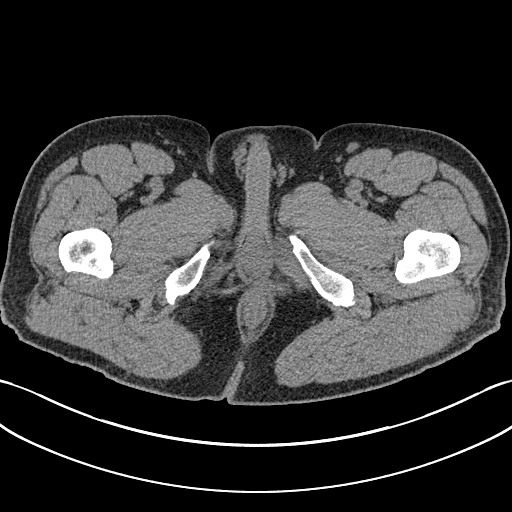
[im 26/131  soft-tissue]
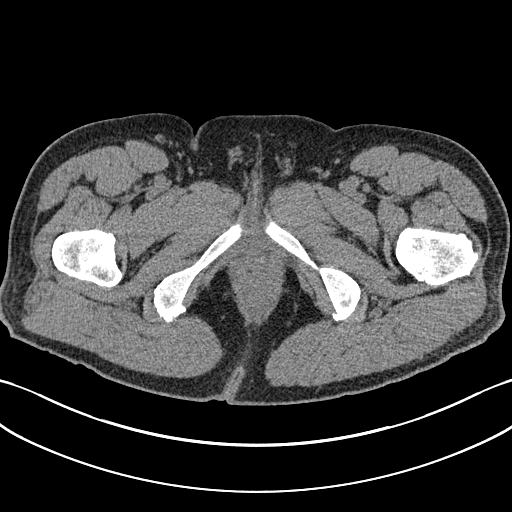
[im 38/131  soft-tissue]
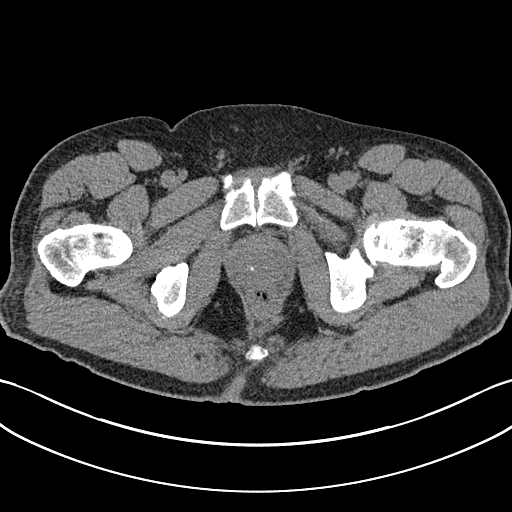
[im 47/131  soft-tissue]
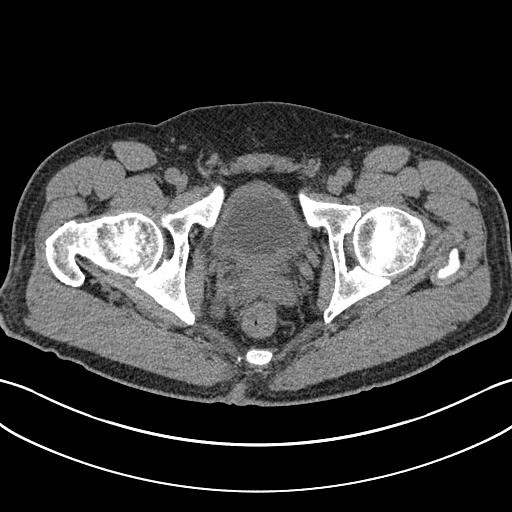
[im 55/131  soft-tissue]
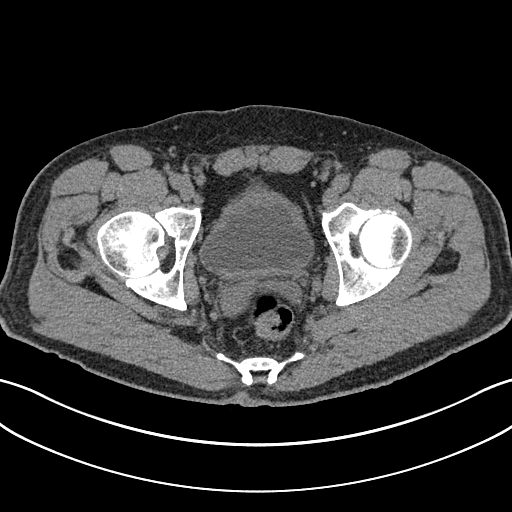
[im 68/131  soft-tissue]
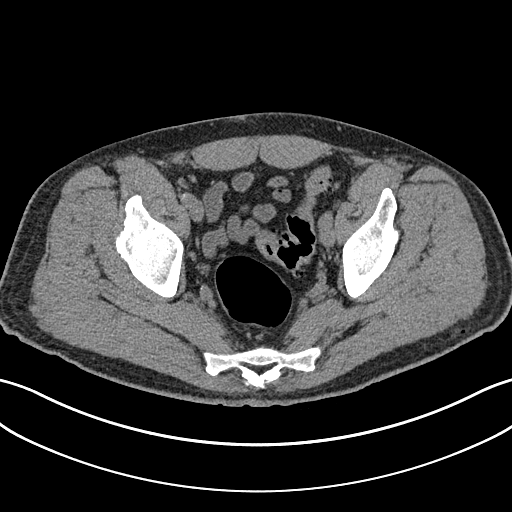
[im 76/131  soft-tissue]
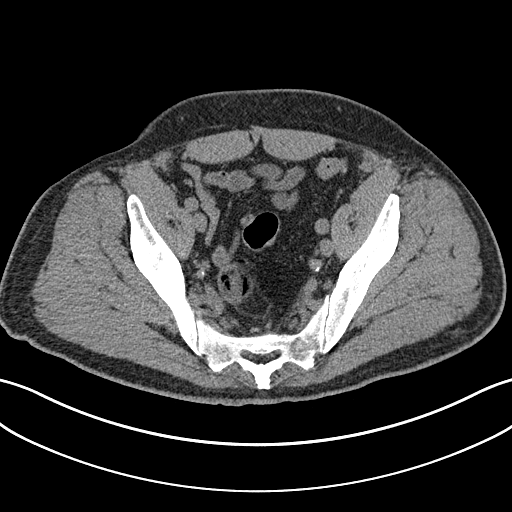
[im 84/131  soft-tissue]
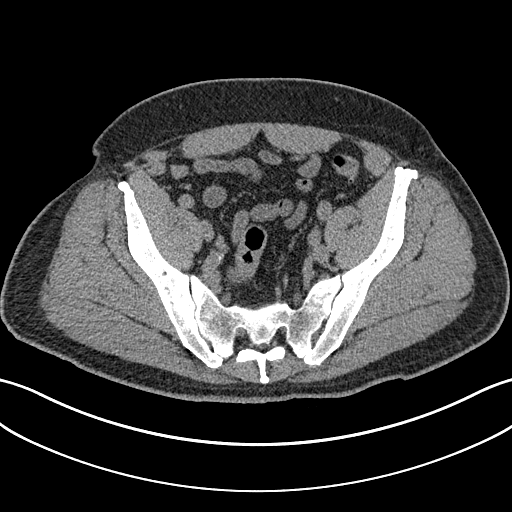
[im 84/131  bone]
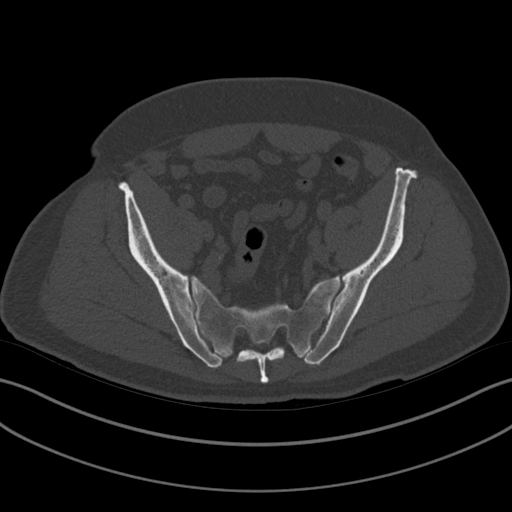
[im 93/131  soft-tissue]
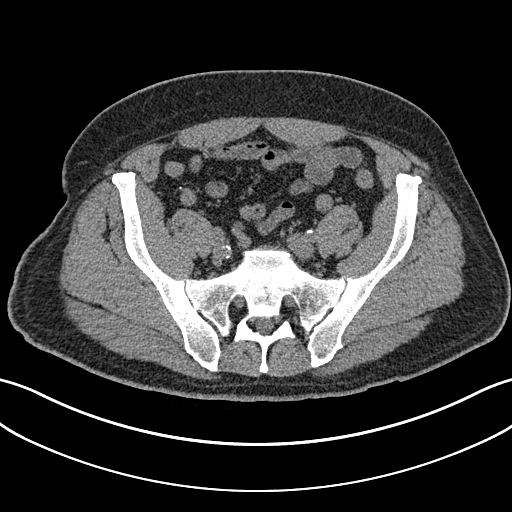
[im 105/131  soft-tissue]
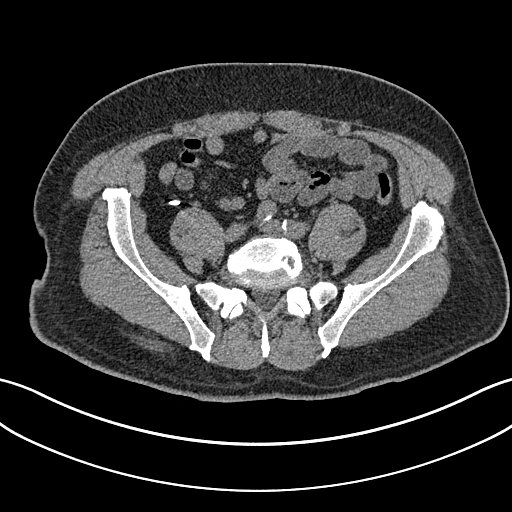
[im 114/131  soft-tissue]
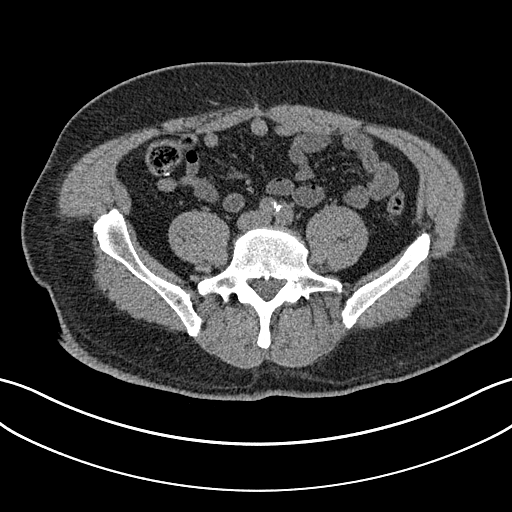
[im 122/131  soft-tissue]
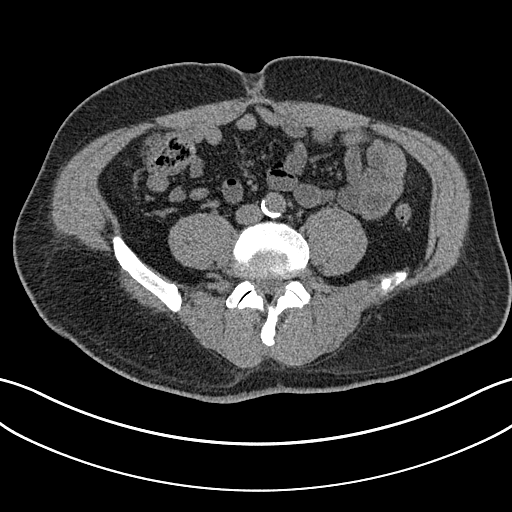

[Series 9: coronal st · coronal · 0.50mm/px · 3 of 107 slices shown]
[im 36/107  soft-tissue]
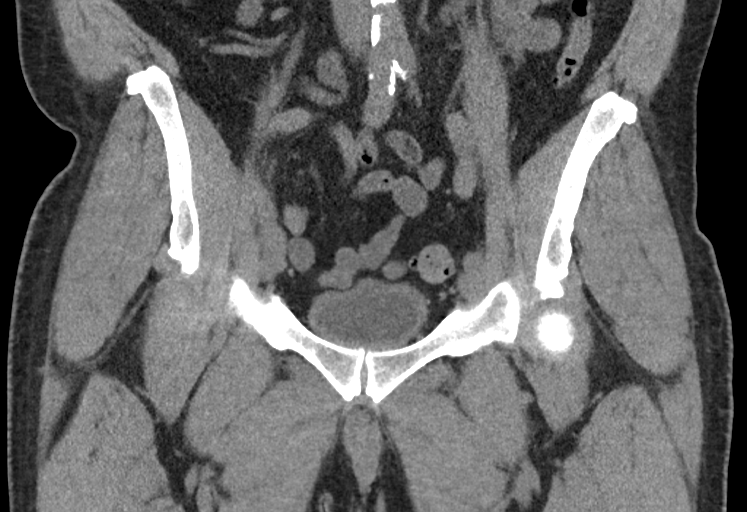
[im 48/107  soft-tissue]
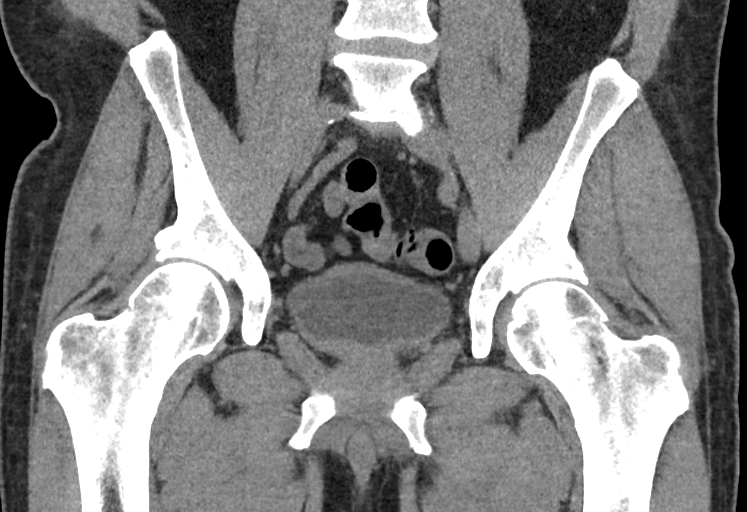
[im 59/107  soft-tissue]
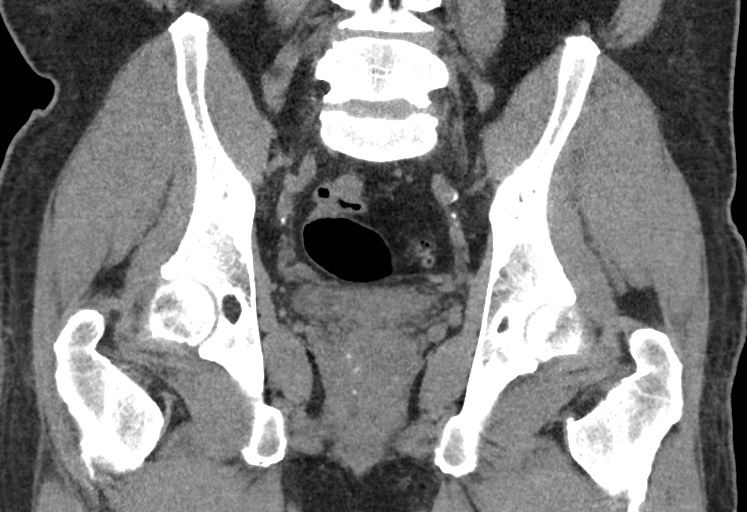

[16 of 46 positions shown; findings below may reference images not displayed]

FINDINGS: No fracture is evident about the hips or pelvis. Pubic symphysis and
sacroiliac joints are intact. No bone lesion or bony destruction.

No soft tissue hematoma.  No soft tissue inflammatory changes.

Visible portions of the lower abdominal and pelvic viscera are
unremarkable.
IMPRESSION: No significant abnormality.

## 2018-08-09 ENCOUNTER — Ambulatory Visit: Payer: Medicare Other | Admitting: Student in an Organized Health Care Education/Training Program

## 2018-08-31 NOTE — Progress Notes (Deleted)
Patient's Name: [name] Gray  MRN: [id number]  Referring Provider: Barbaraann Boys, MD  DOB: April 24, 1951  PCP: Gearldine Shown, DO  DOS: 09/08/2018  Note by: Gillis Santa, MD  Service setting: Ambulatory outpatient  Specialty: Interventional Pain Management  Location: [facility] Virtual Visit  Visit type: Initial Patient Evaluation  Patient type: New Patient   Pain Management Virtual Encounter Note - Virtual Visit via  ***  Telehealth (real-time audio visits between healthcare provider and patient).  Patient's Phone No.:  [phone] (home); There is no such number on file (mobile).; (Preferred) [phone] No e-mail address on record  [place], [place] - [address] [place] [address] Phone: [phone] Fax: [phone]   Pre-screening note:  Our staff contacted [name] Gray and offered him an "in person", "face-to-face" appointment versus a telephone encounter. He indicated preferring the telephone encounter, at this time.  Primary Reason(s) for Visit: Tele-Encounter for initial evaluation of one or more chronic problems (new to examiner) potentially causing chronic pain, and posing a threat to normal musculoskeletal function. (Level of risk: High) CC: No chief complaint on file.  I contacted [name] Gray on 09/08/2018 at 11:29 AM via  ***  and clearly identified myself as Gillis Santa, MD. I verified that I was speaking with the correct person using two identifiers (Name and date of birth: [birth year]).  Advanced Informed Consent I sought verbal advanced consent from [name] Gray for virtual visit interactions. I informed [name] Gray of possible security and privacy concerns, risks, and limitations associated with providing "not-in-person" medical evaluation and management services. I also informed [name] Gray of the availability of "in-person" appointments. Finally, I informed him that there would be a charge for the  virtual visit and that he could be  personally, fully or partially, financially responsible for it. [name] Gray expressed understanding and agreed to proceed.   HPI  [name] Gray is a 68 y.o. year old, male patient, contacted today for an initial evaluation of his chronic pain. He has Anxiety state; Back ache; Barrett esophagus; Benign prostatic hyperplasia with urinary obstruction; Affective bipolar disorder ([place]); Callus of foot; Back pain, chronic; Clinical depression; Acid reflux; Benign essential HTN; Abdominal pain, generalized; HCV antibody positive; Benign prostatic hypertrophy without urinary obstruction; Decreased potassium in the blood; Degenerative arthritis of lumbar spine; Myofascial pain; Arthritis, degenerative; Abnormal toxicological findings; Impaired renal function; Compulsive tobacco user syndrome; BPH with obstruction/lower urinary tract symptoms; H/O urinary retention; Bipolar affective disorder ([place]); and Current tobacco use on their problem list.  Pain Assessment: Location:     Radiating:   Onset:   Duration:   Quality:   Severity:  /10 (subjective, self-reported pain score)  Effect on ADL:   Timing:   Modifying factors:    Onset and Duration: Sudden and Present longer than 3 months Cause of pain: fell on pelvis bone Severity: Getting worse, NAS-11 at its worse: 9/10, NAS-11 at its best: 4/10, NAS-11 now: 7/10 and NAS-11 on the average: 7/10 Timing: Not influenced by the time of the day Aggravating Factors: Bending, Lifiting, Motion, Squatting, Stooping , Twisting, Walking and housework Alleviating Factors: Medications oxycodone Associated Problems: Tingling Quality of Pain: Intermittent and Stabbing Previous Examinations or Tests: X-rays, Neurosurgical evaluation and Orthopedic evaluation Previous Treatments: Narcotic medications and Physical Therapy  ***  The patient was informed that my practice is divided into two sections: an interventional pain management  section, as well as a completely separate and distinct  medication management section. I explained that I have procedure days for my interventional therapies, and evaluation days for follow-ups and medication management. Because of the amount of documentation required during both, they are kept separated. This means that there is the possibility that he may be scheduled for a procedure on one day, and medication management the next. I have also informed him that because of staffing and facility limitations, I no longer take patients for medication management only. To illustrate the reasons for this, I gave the patient the example of surgeons, and how inappropriate it would be to refer a patient to his/her care, just to write for the post-surgical antibiotics on a surgery done by a different surgeon.   Because interventional pain management is my board-certified specialty, the patient was informed that joining my practice means that they are open to any and all interventional therapies. I made it clear that this does not mean that they will be forced to have any procedures done. What this means is that I believe interventional therapies to be essential part of the diagnosis and proper management of chronic pain conditions. Therefore, patients not interested in these interventional alternatives will be better served under the care of a different practitioner.  The patient was also made aware of my Comprehensive Pain Management Safety Guidelines where by joining my practice, they limit all of their nerve blocks and joint injections to those done by our practice, for as long as we are retained to manage their care.   Historic Controlled Substance Pharmacotherapy Review  PMP and historical list of controlled substances: ***  Highest opioid analgesic regimen found: ***  Most recent opioid analgesic: ***  Current opioid analgesics: ***  Highest recorded MME/day: *** mg/day MME/day: *** mg/day Medications:  Bottles not available for inspection. Pharmacodynamics: Desired effects: Analgesia: The patient reports >50% benefit. Reported improvement in function: The patient reports medication allows him to accomplish basic ADLs. Clinically meaningful improvement in function (CMIF): Sustained CMIF goals met Perceived effectiveness: Described as relatively effective, allowing for increase in activities of daily living (ADL) Undesirable effects: Side-effects or Adverse reactions: None reported Historical Monitoring: The patient  reports no history of drug use. List of all UDS Test(s): No results found for: MDMA, COCAINSCRNUR, [place], [place], CANNABQUANT, [place], [place] List of other Serum/Urine Drug Screening Test(s):  No results found for: AMPHSCRSER, BARBSCRSER, BENZOSCRSER, COCAINSCRSER, COCAINSCRNUR, PCPSCRSER, PCPQUANT, THCSCRSER, THCU, CANNABQUANT, OPIATESCRSER, OXYSCRSER, PROPOXSCRSER, ETH Historical Background Evaluation: [place] PMP: PDMP not reviewed this encounter. Six (6) year initial data search conducted.             PMP NARX Score Report:  Narcotic: *** Sedative: *** Stimulant: *** [place] Department of public safety, offender search: Editor, commissioning Information) Non-contributory Risk Assessment Profile: Aberrant behavior: None observed or detected today Risk factors for fatal opioid overdose: None identified today PMP NARX Overdose Risk Score: *** Fatal overdose hazard ratio (HR): Calculation deferred Non-fatal overdose hazard ratio (HR): Calculation deferred Risk of opioid abuse or dependence: 0.7-3.0% with doses ? 36 MME/day and 6.1-26% with doses ? 120 MME/day. Substance use disorder (SUD) risk level: See below Personal History of Substance Abuse (SUD-Substance use disorder):  Alcohol:    Illegal Drugs:    Rx Drugs:    ORT Risk Level calculation:    ORT Scoring interpretation table:  Score <3 = Low Risk for SUD  Score between 4-7 = Moderate Risk for SUD  Score >8 = High Risk for Opioid  Abuse   Pharmacologic Plan: As per protocol,  I have not taken over any controlled substance management, pending the results of ordered tests and/or consults.            Initial impression: Pending review of available data and ordered tests.  Meds   Current Outpatient Medications:  .  amLODipine (NORVASC) 10 MG tablet, Take 10 mg by mouth., Disp: , Rfl:  .  carisoprodol (SOMA) 350 MG tablet, Take 1 tablet (350 mg total) by mouth 3 (three) times daily., Disp: 15 tablet, Rfl: 0 .  citalopram (CELEXA) 40 MG tablet, Take 40 mg by mouth., Disp: , Rfl:  .  doxazosin (CARDURA) 4 MG tablet, Take 1 tablet (4 mg total) by mouth daily., Disp: 30 tablet, Rfl: 12 .  erythromycin ophthalmic ointment, Place a 1/2 inch ribbon of ointment into the lower eyelid., Disp: 1 g, Rfl: 0 .  etodolac (LODINE) 200 MG capsule, Take 1 capsule (200 mg total) by mouth every 8 (eight) hours., Disp: 12 capsule, Rfl: 0 .  hydrochlorothiazide (HYDRODIURIL) 25 MG tablet, Take 25 mg by mouth., Disp: , Rfl:  .  lamoTRIgine (LAMICTAL) 100 MG tablet, Take 100 mg by mouth., Disp: , Rfl:  .  lisinopril (PRINIVIL,ZESTRIL) 40 MG tablet, Take 40 mg by mouth., Disp: , Rfl:  .  metoprolol (LOPRESSOR) 50 MG tablet, Take by mouth., Disp: , Rfl:  .  Multiple Vitamin (MULTI-VITAMINS) TABS, Take by mouth., Disp: , Rfl:   ROS  Cardiovascular: Daily Aspirin intake and High blood pressure Pulmonary or Respiratory: Smoking Neurological: No reported neurological signs or symptoms such as seizures, abnormal skin sensations, urinary and/or fecal incontinence, being born with an abnormal open spine and/or a tethered spinal cord Review of Past Neurological Studies: No results found for this or any previous visit. Psychological-Psychiatric: Depressed and Suicidal ideations Gastrointestinal: Reflux or heatburn Genitourinary: Kidney disease stage 3 Hematological: No reported hematological signs or symptoms such as prolonged bleeding, low or poor  functioning platelets, bruising or bleeding easily, hereditary bleeding problems, low energy levels due to low hemoglobin or being anemic Endocrine: No reported endocrine signs or symptoms such as high or low blood sugar, rapid heart rate due to high thyroid levels, obesity or weight gain due to slow thyroid or thyroid disease Rheumatologic: Generalized muscle aches (Fibromyalgia) Musculoskeletal: Negative for myasthenia gravis, muscular dystrophy, multiple sclerosis or malignant hyperthermia Work History: Disabled  Allergies  [name] Gray is allergic to antihistamines, chlorpheniramine-type.  Laboratory Chemistry  Inflammation Markers (CRP: Acute Phase) (ESR: Chronic Phase) No results found for: CRP, ESRSEDRATE, LATICACIDVEN                       Rheumatology Markers No results found for: RF, ANA, LABURIC, URICUR, LYMEIGGIGMAB, LYMEABIGMQN, HLAB27                      Renal Function Markers Lab Results  Component Value Date   BUN 24 (H) 11/20/2013   CREATININE 2.15 (H) 11/20/2013   GFRAA 37 (L) 11/20/2013   GFRNONAA 32 (L) 11/20/2013                             Hepatic Function Markers No results found for: AST, ALT, ALBUMIN, ALKPHOS, HCVAB, AMYLASE, LIPASE, AMMONIA                      Electrolytes Lab Results  Component Value Date   NA 140 11/20/2013   K 3.1 (  L) 11/20/2013   CL 103 11/20/2013   CALCIUM 8.8 11/20/2013                        Neuropathy Markers No results found for: VITAMINB12, FOLATE, HGBA1C, HIV                      CNS Tests No results found for: COLORCSF, APPEARCSF, RBCCOUNTCSF, WBCCSF, POLYSCSF, LYMPHSCSF, EOSCSF, PROTEINCSF, GLUCCSF, JCVIRUS, CSFOLI, IGGCSF, LABACHR, ACETBL                      Bone Pathology Markers No results found for: VD25OH, [id number], PQ3300TM2, [id number], 25OHVITD1, 25OHVITD2, 25OHVITD3, TESTOFREE, TESTOSTERONE                       Coagulation Parameters Lab Results  Component Value Date   PLT 182 11/20/2013                         Cardiovascular Markers Lab Results  Component Value Date   TROPONINI < 0.02 11/20/2013   HGB 14.5 11/20/2013   HCT 42.8 11/20/2013                         ID Markers No results found for: LYMEIGGIGMAB, HIV                      CA Markers No results found for: CEA, CA125, LABCA2                      Endocrine Markers No results found for: TSH, FREET4, TESTOFREE, TESTOSTERONE, SHBG, ESTRADIOL, ESTRADIOLPCT, ESTRADIOLFRE, LABPREG, ACTH                      Note: Lab results reviewed.  Imaging Review  Cervical Imaging: Cervical MR wo contrast: No results found for this or any previous visit. Cervical MR wo contrast: No procedure found. Cervical MR w/wo contrast: No results found for this or any previous visit. Cervical MR w contrast: No results found for this or any previous visit. Cervical CT wo contrast: No results found for this or any previous visit. Cervical CT w/wo contrast: No results found for this or any previous visit. Cervical CT w/wo contrast: No results found for this or any previous visit. Cervical CT w contrast: No results found for this or any previous visit. Cervical CT outside: No results found for this or any previous visit. Cervical DG 1 view: No results found for this or any previous visit. Cervical DG 2-3 views: No results found for this or any previous visit. Cervical DG F/E views: No results found for this or any previous visit. Cervical DG 2-3 clearing views: No results found for this or any previous visit. Cervical DG Bending/F/E views: No results found for this or any previous visit. Cervical DG complete: No results found for this or any previous visit. Cervical DG Myelogram views: No results found for this or any previous visit. Cervical DG Myelogram views: No results found for this or any previous visit. Cervical Discogram views: No results found for this or any previous visit.  Shoulder Imaging: Shoulder-R MR w contrast: No results  found for this or any previous visit. Shoulder-L MR w contrast: No results found for this or any previous visit. Shoulder-R MR w/wo contrast: No results found for  this or any previous visit. Shoulder-L MR w/wo contrast: No results found for this or any previous visit. Shoulder-R MR wo contrast: No results found for this or any previous visit. Shoulder-L MR wo contrast: No results found for this or any previous visit. Shoulder-R CT w contrast: No results found for this or any previous visit. Shoulder-L CT w contrast: No results found for this or any previous visit. Shoulder-R CT w/wo contrast: No results found for this or any previous visit. Shoulder-L CT w/wo contrast: No results found for this or any previous visit. Shoulder-R CT wo contrast: No results found for this or any previous visit. Shoulder-L CT wo contrast: No results found for this or any previous visit. Shoulder-R DG Arthrogram: No results found for this or any previous visit. Shoulder-L DG Arthrogram: No results found for this or any previous visit. Shoulder-R DG 1 view: No results found for this or any previous visit. Shoulder-L DG 1 view: No results found for this or any previous visit. Shoulder-R DG: No results found for this or any previous visit. Shoulder-L DG: No results found for this or any previous visit.  Thoracic Imaging: Thoracic MR wo contrast: No results found for this or any previous visit. Thoracic MR wo contrast: No procedure found. Thoracic MR w/wo contrast: No results found for this or any previous visit. Thoracic MR w contrast: No results found for this or any previous visit. Thoracic CT wo contrast: No results found for this or any previous visit. Thoracic CT w/wo contrast: No results found for this or any previous visit. Thoracic CT w/wo contrast: No results found for this or any previous visit. Thoracic CT w contrast: No results found for this or any previous visit. Thoracic DG 2-3 views: No results  found for this or any previous visit. Thoracic DG 4 views: No results found for this or any previous visit. Thoracic DG: No results found for this or any previous visit. Thoracic DG w/swimmers view: No results found for this or any previous visit. Thoracic DG Myelogram views: No results found for this or any previous visit. Thoracic DG Myelogram views: No results found for this or any previous visit.  Lumbosacral Imaging: Lumbar MR wo contrast: No results found for this or any previous visit. Lumbar MR wo contrast: No procedure found. Lumbar MR w/wo contrast: No results found for this or any previous visit. Lumbar MR w/wo contrast: No results found for this or any previous visit. Lumbar MR w contrast: No results found for this or any previous visit. Lumbar CT wo contrast: No results found for this or any previous visit. Lumbar CT w/wo contrast: No results found for this or any previous visit. Lumbar CT w/wo contrast: No results found for this or any previous visit. Lumbar CT w contrast: No results found for this or any previous visit. Lumbar DG 1V: No results found for this or any previous visit. Lumbar DG 1V (Clearing): No results found for this or any previous visit. Lumbar DG 2-3V (Clearing): No results found for this or any previous visit. Lumbar DG 2-3 views: No results found for this or any previous visit. Lumbar DG (Complete) 4+V: No results found for this or any previous visit.       Lumbar DG F/E views: No results found for this or any previous visit.       Lumbar DG Bending views: No results found for this or any previous visit.       Lumbar DG Myelogram views: No results  found for this or any previous visit. Lumbar DG Myelogram: No results found for this or any previous visit. Lumbar DG Myelogram: No results found for this or any previous visit. Lumbar DG Myelogram: No results found for this or any previous visit. Lumbar DG Myelogram Lumbosacral: No results found for this or  any previous visit. Lumbar DG Diskogram views: No results found for this or any previous visit. Lumbar DG Diskogram views: No results found for this or any previous visit. Lumbar DG Epidurogram OP: No results found for this or any previous visit. Lumbar DG Epidurogram IP: No results found for this or any previous visit.  Sacroiliac Joint Imaging: Sacroiliac Joint DG: No results found for this or any previous visit. Sacroiliac Joint MR w/wo contrast: No results found for this or any previous visit. Sacroiliac Joint MR wo contrast: No results found for this or any previous visit.  Spine Imaging: Whole Spine DG Myelogram views: No results found for this or any previous visit. Whole Spine MR Mets screen: No results found for this or any previous visit. Whole Spine MR Mets screen: No results found for this or any previous visit. Whole Spine MR w/wo: No results found for this or any previous visit. MRA Spinal Canal w/ cm: No results found for this or any previous visit. MRA Spinal Canal wo/ cm: No procedure found. MRA Spinal Canal w/wo cm: No results found for this or any previous visit. Spine Outside MR Films: No results found for this or any previous visit. Spine Outside CT Films: No results found for this or any previous visit. CT-Guided Biopsy: No results found for this or any previous visit. CT-Guided Needle Placement: No results found for this or any previous visit. DG Spine outside: No results found for this or any previous visit. IR Spine outside: No results found for this or any previous visit. NM Spine outside: No results found for this or any previous visit.  Hip Imaging: Hip-R MR w contrast: No results found for this or any previous visit. Hip-L MR w contrast: No results found for this or any previous visit. Hip-R MR w/wo contrast: No results found for this or any previous visit. Hip-L MR w/wo contrast: No results found for this or any previous visit. Hip-R MR wo contrast: No  results found for this or any previous visit. Hip-L MR wo contrast: No results found for this or any previous visit. Hip-R CT w contrast: No results found for this or any previous visit. Hip-L CT w contrast: No results found for this or any previous visit. Hip-R CT w/wo contrast: No results found for this or any previous visit. Hip-L CT w/wo contrast: No results found for this or any previous visit. Hip-R CT wo contrast: No results found for this or any previous visit. Hip-L CT wo contrast: No results found for this or any previous visit. Hip-R DG 2-3 views: No results found for this or any previous visit. Hip-L DG 2-3 views:  Results for orders placed during the hospital encounter of 01/13/16  DG Hip Unilat With Pelvis 2-3 Views Left   Narrative CLINICAL DATA:  Slip on floor getting out of the shower six days prior, now with left hip pain radiating down left leg.  EXAM: DG HIP (WITH OR WITHOUT PELVIS) 2-3V LEFT  COMPARISON:  None.  FINDINGS: The cortical margins of the bony pelvis and left hip are intact. No fracture. Mild bilateral hip degenerative change. Pubic symphysis and sacroiliac joints are congruent. Both femoral heads  are well-seated in the respective acetabula.  IMPRESSION: No fracture or subluxation of the pelvis or left hip.   Electronically Signed   By: Jeb Levering M.D.   On: 01/13/2016 02:31    Hip-R DG Arthrogram: No results found for this or any previous visit. Hip-L DG Arthrogram: No results found for this or any previous visit. Hip-B DG Bilateral: No results found for this or any previous visit.  Knee Imaging: Knee-R MR w contrast: No results found for this or any previous visit. Knee-L MR w contrast: No results found for this or any previous visit. Knee-R MR w/wo contrast: No results found for this or any previous visit. Knee-L MR w/wo contrast: No results found for this or any previous visit. Knee-R MR wo contrast: No results found for this or  any previous visit. Knee-L MR wo contrast: No results found for this or any previous visit. Knee-R CT w contrast: No results found for this or any previous visit. Knee-L CT w contrast: No results found for this or any previous visit. Knee-R CT w/wo contrast: No results found for this or any previous visit. Knee-L CT w/wo contrast: No results found for this or any previous visit. Knee-R CT wo contrast: No results found for this or any previous visit. Knee-L CT wo contrast: No results found for this or any previous visit. Knee-R DG 1-2 views: No results found for this or any previous visit. Knee-L DG 1-2 views: No results found for this or any previous visit. Knee-R DG 3 views: No results found for this or any previous visit. Knee-L DG 3 views: No results found for this or any previous visit. Knee-R DG 4 views: No results found for this or any previous visit. Knee-L DG 4 views: No results found for this or any previous visit. Knee-R DG Arthrogram: No results found for this or any previous visit. Knee-L DG Arthrogram: No results found for this or any previous visit.  Ankle Imaging: Ankle-R DG Complete: No results found for this or any previous visit. Ankle-L DG Complete: No results found for this or any previous visit.  Foot Imaging: Foot-R DG Complete: No results found for this or any previous visit. Foot-L DG Complete: No results found for this or any previous visit.  Elbow Imaging: Elbow-R DG Complete: No results found for this or any previous visit. Elbow-L DG Complete: No results found for this or any previous visit.  Wrist Imaging: Wrist-R DG Complete: No results found for this or any previous visit. Wrist-L DG Complete: No results found for this or any previous visit.  Hand Imaging: Hand-R DG Complete: No results found for this or any previous visit. Hand-L DG Complete: No results found for this or any previous visit.  Complexity Note: Imaging results reviewed. Results shared  with [name] Gray, using Layman's terms.                         [place]  Drug: [name] Gray  reports no history of drug use. Alcohol:  reports no history of alcohol use. Tobacco:  reports that he has been smoking cigarettes. He has never used smokeless tobacco. Medical:  has a past medical history of Chronic kidney disease, Depression, Heart burn, HTN (hypertension), and Prostatitis. Family: family history is not on file.  Past Surgical History:  Procedure Laterality Date  . APPENDECTOMY    . gall bladdder     Active Ambulatory Problems    Diagnosis Date Noted  .  Anxiety state 11/25/2010  . Back ache 05/09/2012  . Barrett esophagus 07/17/2013  . Benign prostatic hyperplasia with urinary obstruction 01/18/2014  . Affective bipolar disorder ([place]) 04/14/2012  . Callus of foot 05/09/2012  . Back pain, chronic 07/24/2013  . Clinical depression 11/25/2010  . Acid reflux 11/25/2010  . Benign essential HTN 11/25/2010  . Abdominal pain, generalized 09/18/2013  . 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 Benign prostatic hypertrophy without urinary obstruction 11/25/2010  . Decreased potassium in the blood 07/18/2012  . 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 Myofascial pain 06/14/2013  . Arthritis, degenerative 11/25/2010  . Abnormal toxicological findings 09/23/2012  . Impaired renal function 04/16/2011  . Compulsive tobacco user syndrome 11/25/2010  . BPH with obstruction/lower urinary tract symptoms 04/24/2015  . H/O urinary retention 04/24/2015  . Bipolar affective disorder ([place]) 04/14/2012  . Current tobacco use 11/25/2010   Resolved Ambulatory Problems    Diagnosis Date Noted  . No Resolved Ambulatory Problems   Past Medical History:  Diagnosis Date  . Chronic kidney disease   . Depression   . Heart burn   . HTN (hypertension)   . Prostatitis    Assessment  Primary Diagnosis & Pertinent Problem List: There were no encounter diagnoses.  Visit Diagnosis (New  problems to examiner): No diagnosis found. Plan of Care (Initial workup plan)  Note: Mr. Parke was reminded that as per protocol, today's visit has been an evaluation only. We have not taken over the patient's controlled substance management.  Problem-specific plan: No problem-specific Assessment & Plan notes found for this encounter.  Lab Orders  No laboratory test(s) ordered today   Imaging Orders  No imaging studies ordered today   Referral Orders  No referral(s) requested today   Procedure Orders    No procedure(s) ordered today   Pharmacotherapy (current): Medications ordered:  No orders of the defined types were placed in this encounter.  Medications administered during this visit: [name] Gray had no medications administered during this visit.   Pharmacological management options:  Opioid Analgesics: The patient was informed that there is no guarantee that he would be a candidate for opioid analgesics. The decision will be made following CDC guidelines. This decision will be based on the results of diagnostic studies, as well as Mr. Keown risk profile.   Membrane stabilizer: To be determined at a later time  Muscle relaxant: To be determined at a later time  NSAID: To be determined at a later time  Other analgesic(s): To be determined at a later time   Interventional management options: Mr. Hoctor was informed that there is no guarantee that he would be a candidate for interventional therapies. The decision will be based on the results of diagnostic studies, as well as Mr. Lamantia risk profile.  Procedure(s) under consideration:  ***   Provider-requested follow-up: No follow-ups on file.  Future Appointments  Date Time Provider [place]  09/08/2018 11:00 AM Gillis Santa, MD [facility] None    Total duration of non-face-to-face encounter: *** minutes.  Primary Care Physician: Gearldine Shown, DO Location: [facility]  Facility Note by: Gillis Santa, MD Date: 09/08/2018; Time: 11:29 AM

## 2018-09-08 ENCOUNTER — Other Ambulatory Visit: Payer: Self-pay

## 2018-09-08 ENCOUNTER — Ambulatory Visit
Payer: Medicare Other | Attending: Student in an Organized Health Care Education/Training Program | Admitting: Student in an Organized Health Care Education/Training Program

## 2018-09-08 DIAGNOSIS — G894 Chronic pain syndrome: Secondary | ICD-10-CM

## 2018-09-12 NOTE — Progress Notes (Deleted)
Patient's Name: [name] Gray  MRN: [id number]  Referring Provider: Barbaraann Boys, MD  DOB: 1950/11/17  PCP: Gearldine Shown, DO  DOS: 09/15/2018  Note by: Gillis Santa, MD  Service setting: Ambulatory outpatient  Specialty: Interventional Pain Management  Location: [facility] Virtual Visit  Visit type: Initial Patient Evaluation  Patient type: New Patient   Pain Management Virtual Encounter Note - Virtual Visit via  ***  Telehealth (real-time audio visits between healthcare provider and patient).  Patient's Phone No.:  [phone] (home); There is no such number on file (mobile).; (Preferred) [phone] No e-mail address on record  [place], [place] - [address] [place] [address] Phone: [phone] Fax: [phone]   Pre-screening note:  Our staff contacted [name] Gray and offered him an "in person", "face-to-face" appointment versus a telephone encounter. He indicated preferring the telephone encounter, at this time.  Primary Reason(s) for Visit: Tele-Encounter for initial evaluation of one or more chronic problems (new to examiner) potentially causing chronic pain, and posing a threat to normal musculoskeletal function. (Level of risk: High) CC: No chief complaint on file.  I contacted [name] Gray on 09/15/2018 at 12:03 PM via  *** .      I clearly identified myself as Gillis Santa, MD. I verified that I was speaking with the correct person using two identifiers (Name and date of birth: 1951/01/01).  Advanced Informed Consent I sought verbal advanced consent from [name] Gray for virtual visit interactions. I informed [name] Gray of possible security and privacy concerns, risks, and limitations associated with providing "not-in-person" medical evaluation and management services. I also informed [name] Gray of the availability of "in-person" appointments. Finally, I informed him that there would  be a charge for the virtual visit and that he could be  personally, fully or partially, financially responsible for it. [name] Gray expressed understanding and agreed to proceed.   HPI  [name] Gray is a 68 y.o. year old, male patient, contacted today for an initial evaluation of his chronic pain. He has Anxiety state; Back ache; Barrett esophagus; Benign prostatic hyperplasia with urinary obstruction; Affective bipolar disorder ([place]); Callus of foot; Back pain, chronic; Clinical depression; Acid reflux; Benign essential HTN; Abdominal pain, generalized; HCV antibody positive; Benign prostatic hypertrophy without urinary obstruction; Decreased potassium in the blood; Degenerative arthritis of lumbar spine; Myofascial pain; Arthritis, degenerative; Abnormal toxicological findings; Impaired renal function; Compulsive tobacco user syndrome; BPH with obstruction/lower urinary tract symptoms; H/O urinary retention; Bipolar affective disorder ([place]); and Current tobacco use on their problem list.  Pain Assessment: Location:     Radiating:   Onset:   Duration:   Quality:   Severity:  /10 (subjective, self-reported pain score)  Effect on ADL:   Timing:   Modifying factors:    Onset and Duration: {Hx; Onset and Duration:210120511} Cause of pain: {Hx; Cause:210120521} Severity: {Pain Severity:210120502} Timing: {Symptoms; Timing:210120501} Aggravating Factors: {Causes; Aggravating pain factors:210120507} Alleviating Factors: {Causes; Alleviating Factors:210120500} Associated Problems: {Hx; Associated problems:210120515} Quality of Pain: {Hx; Symptom quality or Descriptor:210120531} Previous Examinations or Tests: {Hx; Previous examinations or test:210120529} Previous Treatments: {Hx; Previous Treatment:210120503}  ***  The patient was informed that my practice is divided into two sections: an interventional pain management section, as well as a completely separate and distinct medication management  section. I explained that I have procedure days for my interventional therapies, and evaluation days for follow-ups and medication management. Because of the amount of documentation required  during both, they are kept separated. This means that there is the possibility that he may be scheduled for a procedure on one day, and medication management the next. I have also informed him that because of staffing and facility limitations, I no longer take patients for medication management only. To illustrate the reasons for this, I gave the patient the example of surgeons, and how inappropriate it would be to refer a patient to his/her care, just to write for the post-surgical antibiotics on a surgery done by a different surgeon.   Because interventional pain management is my board-certified specialty, the patient was informed that joining my practice means that they are open to any and all interventional therapies. I made it clear that this does not mean that they will be forced to have any procedures done. What this means is that I believe interventional therapies to be essential part of the diagnosis and proper management of chronic pain conditions. Therefore, patients not interested in these interventional alternatives will be better served under the care of a different practitioner.  The patient was also made aware of my Comprehensive Pain Management Safety Guidelines where by joining my practice, they limit all of their nerve blocks and joint injections to those done by our practice, for as long as we are retained to manage their care.   Historic Controlled Substance Pharmacotherapy Review  PMP and historical list of controlled substances: ***  Highest opioid analgesic regimen found: ***  Most recent opioid analgesic: ***  Current opioid analgesics: ***  Highest recorded MME/day: *** mg/day MME/day: *** mg/day Medications: Bottles not available for inspection. Pharmacodynamics: Desired  effects: Analgesia: The patient reports >50% benefit. Reported improvement in function: The patient reports medication allows him to accomplish basic ADLs. Clinically meaningful improvement in function (CMIF): Sustained CMIF goals met Perceived effectiveness: Described as relatively effective, allowing for increase in activities of daily living (ADL) Undesirable effects: Side-effects or Adverse reactions: None reported Historical Monitoring: The patient  reports no history of drug use. List of all UDS Test(s): No results found for: MDMA, COCAINSCRNUR, [place], [place], CANNABQUANT, [place], [place] List of other Serum/Urine Drug Screening Test(s):  No results found for: AMPHSCRSER, BARBSCRSER, BENZOSCRSER, COCAINSCRSER, COCAINSCRNUR, PCPSCRSER, PCPQUANT, THCSCRSER, THCU, CANNABQUANT, OPIATESCRSER, OXYSCRSER, PROPOXSCRSER, ETH Historical Background Evaluation: [place] PMP: PDMP not reviewed this encounter. Six (6) year initial data search conducted.             PMP NARX Score Report:  Narcotic: *** Sedative: *** Stimulant: *** [place] Department of public safety, offender search: Editor, commissioning Information) Non-contributory Risk Assessment Profile: Aberrant behavior: None observed or detected today Risk factors for fatal opioid overdose: None identified today PMP NARX Overdose Risk Score: *** Fatal overdose hazard ratio (HR): Calculation deferred Non-fatal overdose hazard ratio (HR): Calculation deferred Risk of opioid abuse or dependence: 0.7-3.0% with doses ? 36 MME/day and 6.1-26% with doses ? 120 MME/day. Substance use disorder (SUD) risk level: See below Personal History of Substance Abuse (SUD-Substance use disorder):  Alcohol:    Illegal Drugs:    Rx Drugs:    ORT Risk Level calculation:    ORT Scoring interpretation table:  Score <3 = Low Risk for SUD  Score between 4-7 = Moderate Risk for SUD  Score >8 = High Risk for Opioid Abuse   Pharmacologic Plan: As per protocol, I have not taken over  any controlled substance management, pending the results of ordered tests and/or consults.            Initial  impression: Pending review of available data and ordered tests.  Meds   Current Outpatient Medications:  .  amLODipine (NORVASC) 10 MG tablet, Take 10 mg by mouth., Disp: , Rfl:  .  carisoprodol (SOMA) 350 MG tablet, Take 1 tablet (350 mg total) by mouth 3 (three) times daily., Disp: 15 tablet, Rfl: 0 .  citalopram (CELEXA) 40 MG tablet, Take 40 mg by mouth., Disp: , Rfl:  .  doxazosin (CARDURA) 4 MG tablet, Take 1 tablet (4 mg total) by mouth daily., Disp: 30 tablet, Rfl: 12 .  erythromycin ophthalmic ointment, Place a 1/2 inch ribbon of ointment into the lower eyelid. (Patient not taking: Reported on 09/07/2018), Disp: 1 g, Rfl: 0 .  etodolac (LODINE) 200 MG capsule, Take 1 capsule (200 mg total) by mouth every 8 (eight) hours., Disp: 12 capsule, Rfl: 0 .  hydrochlorothiazide (HYDRODIURIL) 25 MG tablet, Take 25 mg by mouth., Disp: , Rfl:  .  lamoTRIgine (LAMICTAL) 100 MG tablet, Take 100 mg by mouth., Disp: , Rfl:  .  lisinopril (PRINIVIL,ZESTRIL) 40 MG tablet, Take 40 mg by mouth., Disp: , Rfl:  .  metoprolol (LOPRESSOR) 50 MG tablet, Take by mouth., Disp: , Rfl:  .  Multiple Vitamin (MULTI-VITAMINS) TABS, Take by mouth., Disp: , Rfl:  .  oxyCODONE (OXY IR/ROXICODONE) 5 MG immediate release tablet, Take by mouth., Disp: , Rfl:   ROS  Cardiovascular: {Hx; Cardiovascular History:210120525} Pulmonary or Respiratory: {Hx; Pumonary and/or Respiratory History:210120523} Neurological: {Hx; Neurological:210120504} Review of Past Neurological Studies: No results found for this or any previous visit. Psychological-Psychiatric: {Hx; Psychological-Psychiatric History:210120512} Gastrointestinal: {Hx; Gastrointestinal:210120527} Genitourinary: {Hx; Genitourinary:210120506} Hematological: {Hx; Hematological:210120510} Endocrine: {Hx; Endocrine history:210120509} Rheumatologic: {Hx;  Rheumatological:210120530} Musculoskeletal: {Hx; Musculoskeletal:210120528} Work History: {Hx; Work history:210120514}  Allergies  Mr. Loadholt is allergic to latex; antihistamines, chlorpheniramine-type; and tramadol.  Laboratory Chemistry  Inflammation Markers (CRP: Acute Phase) (ESR: Chronic Phase) No results found for: CRP, ESRSEDRATE, LATICACIDVEN                       Rheumatology Markers No results found for: RF, ANA, LABURIC, URICUR, LYMEIGGIGMAB, LYMEABIGMQN, HLAB27                      Renal Function Markers Lab Results  Component Value Date   BUN 24 (H) 11/20/2013   CREATININE 2.15 (H) 11/20/2013   GFRAA 37 (L) 11/20/2013   GFRNONAA 32 (L) 11/20/2013                             Hepatic Function Markers No results found for: AST, ALT, ALBUMIN, ALKPHOS, HCVAB, AMYLASE, LIPASE, AMMONIA                      Electrolytes Lab Results  Component Value Date   NA 140 11/20/2013   K 3.1 (L) 11/20/2013   CL 103 11/20/2013   CALCIUM 8.8 11/20/2013                        Neuropathy Markers No results found for: VITAMINB12, FOLATE, HGBA1C, HIV                      CNS Tests No results found for: COLORCSF, APPEARCSF, RBCCOUNTCSF, WBCCSF, POLYSCSF, LYMPHSCSF, EOSCSF, PROTEINCSF, GLUCCSF, JCVIRUS, CSFOLI, IGGCSF, LABACHR, ACETBL  Bone Pathology Markers No results found for: VD25OH, H139778, [id number], PV3748OL0, 25OHVITD1, 25OHVITD2, 25OHVITD3, TESTOFREE, TESTOSTERONE                       Coagulation Parameters Lab Results  Component Value Date   PLT 182 11/20/2013                        Cardiovascular Markers Lab Results  Component Value Date   TROPONINI < 0.02 11/20/2013   HGB 14.5 11/20/2013   HCT 42.8 11/20/2013                         ID Markers No results found for: LYMEIGGIGMAB, HIV                      CA Markers No results found for: CEA, CA125, LABCA2                      Endocrine Markers No results found for: TSH, FREET4,  TESTOFREE, TESTOSTERONE, SHBG, ESTRADIOL, ESTRADIOLPCT, ESTRADIOLFRE, LABPREG, ACTH                      Note: Lab results reviewed.  Imaging Review  Cervical Imaging: Cervical MR wo contrast: No results found for this or any previous visit. Cervical MR wo contrast: No procedure found. Cervical MR w/wo contrast: No results found for this or any previous visit. Cervical MR w contrast: No results found for this or any previous visit. Cervical CT wo contrast: No results found for this or any previous visit. Cervical CT w/wo contrast: No results found for this or any previous visit. Cervical CT w/wo contrast: No results found for this or any previous visit. Cervical CT w contrast: No results found for this or any previous visit. Cervical CT outside: No results found for this or any previous visit. Cervical DG 1 view: No results found for this or any previous visit. Cervical DG 2-3 views: No results found for this or any previous visit. Cervical DG F/E views: No results found for this or any previous visit. Cervical DG 2-3 clearing views: No results found for this or any previous visit. Cervical DG Bending/F/E views: No results found for this or any previous visit. Cervical DG complete: No results found for this or any previous visit. Cervical DG Myelogram views: No results found for this or any previous visit. Cervical DG Myelogram views: No results found for this or any previous visit. Cervical Discogram views: No results found for this or any previous visit.  Shoulder Imaging: Shoulder-R MR w contrast: No results found for this or any previous visit. Shoulder-L MR w contrast: No results found for this or any previous visit. Shoulder-R MR w/wo contrast: No results found for this or any previous visit. Shoulder-L MR w/wo contrast: No results found for this or any previous visit. Shoulder-R MR wo contrast: No results found for this or any previous visit. Shoulder-L MR wo contrast: No  results found for this or any previous visit. Shoulder-R CT w contrast: No results found for this or any previous visit. Shoulder-L CT w contrast: No results found for this or any previous visit. Shoulder-R CT w/wo contrast: No results found for this or any previous visit. Shoulder-L CT w/wo contrast: No results found for this or any previous visit. Shoulder-R CT wo contrast: No results found for this or any  previous visit. Shoulder-L CT wo contrast: No results found for this or any previous visit. Shoulder-R DG Arthrogram: No results found for this or any previous visit. Shoulder-L DG Arthrogram: No results found for this or any previous visit. Shoulder-R DG 1 view: No results found for this or any previous visit. Shoulder-L DG 1 view: No results found for this or any previous visit. Shoulder-R DG: No results found for this or any previous visit. Shoulder-L DG: No results found for this or any previous visit.  Thoracic Imaging: Thoracic MR wo contrast: No results found for this or any previous visit. Thoracic MR wo contrast: No procedure found. Thoracic MR w/wo contrast: No results found for this or any previous visit. Thoracic MR w contrast: No results found for this or any previous visit. Thoracic CT wo contrast: No results found for this or any previous visit. Thoracic CT w/wo contrast: No results found for this or any previous visit. Thoracic CT w/wo contrast: No results found for this or any previous visit. Thoracic CT w contrast: No results found for this or any previous visit. Thoracic DG 2-3 views: No results found for this or any previous visit. Thoracic DG 4 views: No results found for this or any previous visit. Thoracic DG: No results found for this or any previous visit. Thoracic DG w/swimmers view: No results found for this or any previous visit. Thoracic DG Myelogram views: No results found for this or any previous visit. Thoracic DG Myelogram views: No results found for  this or any previous visit.  Lumbosacral Imaging: Lumbar MR wo contrast: No results found for this or any previous visit. Lumbar MR wo contrast: No procedure found. Lumbar MR w/wo contrast: No results found for this or any previous visit. Lumbar MR w/wo contrast: No results found for this or any previous visit. Lumbar MR w contrast: No results found for this or any previous visit. Lumbar CT wo contrast: No results found for this or any previous visit. Lumbar CT w/wo contrast: No results found for this or any previous visit. Lumbar CT w/wo contrast: No results found for this or any previous visit. Lumbar CT w contrast: No results found for this or any previous visit. Lumbar DG 1V: No results found for this or any previous visit. Lumbar DG 1V (Clearing): No results found for this or any previous visit. Lumbar DG 2-3V (Clearing): No results found for this or any previous visit. Lumbar DG 2-3 views: No results found for this or any previous visit. Lumbar DG (Complete) 4+V: No results found for this or any previous visit.       Lumbar DG F/E views: No results found for this or any previous visit.       Lumbar DG Bending views: No results found for this or any previous visit.       Lumbar DG Myelogram views: No results found for this or any previous visit. Lumbar DG Myelogram: No results found for this or any previous visit. Lumbar DG Myelogram: No results found for this or any previous visit. Lumbar DG Myelogram: No results found for this or any previous visit. Lumbar DG Myelogram Lumbosacral: No results found for this or any previous visit. Lumbar DG Diskogram views: No results found for this or any previous visit. Lumbar DG Diskogram views: No results found for this or any previous visit. Lumbar DG Epidurogram OP: No results found for this or any previous visit. Lumbar DG Epidurogram IP: No results found for this or any previous  visit.  Sacroiliac Joint Imaging: Sacroiliac Joint DG: No  results found for this or any previous visit. Sacroiliac Joint MR w/wo contrast: No results found for this or any previous visit. Sacroiliac Joint MR wo contrast: No results found for this or any previous visit.  Spine Imaging: Whole Spine DG Myelogram views: No results found for this or any previous visit. Whole Spine MR Mets screen: No results found for this or any previous visit. Whole Spine MR Mets screen: No results found for this or any previous visit. Whole Spine MR w/wo: No results found for this or any previous visit. MRA Spinal Canal w/ cm: No results found for this or any previous visit. MRA Spinal Canal wo/ cm: No procedure found. MRA Spinal Canal w/wo cm: No results found for this or any previous visit. Spine Outside MR Films: No results found for this or any previous visit. Spine Outside CT Films: No results found for this or any previous visit. CT-Guided Biopsy: No results found for this or any previous visit. CT-Guided Needle Placement: No results found for this or any previous visit. DG Spine outside: No results found for this or any previous visit. IR Spine outside: No results found for this or any previous visit. NM Spine outside: No results found for this or any previous visit.  Hip Imaging: Hip-R MR w contrast: No results found for this or any previous visit. Hip-L MR w contrast: No results found for this or any previous visit. Hip-R MR w/wo contrast: No results found for this or any previous visit. Hip-L MR w/wo contrast: No results found for this or any previous visit. Hip-R MR wo contrast: No results found for this or any previous visit. Hip-L MR wo contrast: No results found for this or any previous visit. Hip-R CT w contrast: No results found for this or any previous visit. Hip-L CT w contrast: No results found for this or any previous visit. Hip-R CT w/wo contrast: No results found for this or any previous visit. Hip-L CT w/wo contrast: No results found for  this or any previous visit. Hip-R CT wo contrast: No results found for this or any previous visit. Hip-L CT wo contrast: No results found for this or any previous visit. Hip-R DG 2-3 views: No results found for this or any previous visit. Hip-L DG 2-3 views:  Results for orders placed during the hospital encounter of 01/13/16  DG Hip Unilat With Pelvis 2-3 Views Left   Narrative CLINICAL DATA:  Slip on floor getting out of the shower six days prior, now with left hip pain radiating down left leg.  EXAM: DG HIP (WITH OR WITHOUT PELVIS) 2-3V LEFT  COMPARISON:  None.  FINDINGS: The cortical margins of the bony pelvis and left hip are intact. No fracture. Mild bilateral hip degenerative change. Pubic symphysis and sacroiliac joints are congruent. Both femoral heads are well-seated in the respective acetabula.  IMPRESSION: No fracture or subluxation of the pelvis or left hip.   Electronically Signed   By: Jeb Levering M.D.   On: 01/13/2016 02:31    Hip-R DG Arthrogram: No results found for this or any previous visit. Hip-L DG Arthrogram: No results found for this or any previous visit. Hip-B DG Bilateral: No results found for this or any previous visit.  Knee Imaging: Knee-R MR w contrast: No results found for this or any previous visit. Knee-L MR w contrast: No results found for this or any previous visit. Knee-R MR w/wo contrast: No  results found for this or any previous visit. Knee-L MR w/wo contrast: No results found for this or any previous visit. Knee-R MR wo contrast: No results found for this or any previous visit. Knee-L MR wo contrast: No results found for this or any previous visit. Knee-R CT w contrast: No results found for this or any previous visit. Knee-L CT w contrast: No results found for this or any previous visit. Knee-R CT w/wo contrast: No results found for this or any previous visit. Knee-L CT w/wo contrast: No results found for this or any previous  visit. Knee-R CT wo contrast: No results found for this or any previous visit. Knee-L CT wo contrast: No results found for this or any previous visit. Knee-R DG 1-2 views: No results found for this or any previous visit. Knee-L DG 1-2 views: No results found for this or any previous visit. Knee-R DG 3 views: No results found for this or any previous visit. Knee-L DG 3 views: No results found for this or any previous visit. Knee-R DG 4 views: No results found for this or any previous visit. Knee-L DG 4 views: No results found for this or any previous visit. Knee-R DG Arthrogram: No results found for this or any previous visit. Knee-L DG Arthrogram: No results found for this or any previous visit.  Ankle Imaging: Ankle-R DG Complete: No results found for this or any previous visit. Ankle-L DG Complete: No results found for this or any previous visit.  Foot Imaging: Foot-R DG Complete: No results found for this or any previous visit. Foot-L DG Complete: No results found for this or any previous visit.  Elbow Imaging: Elbow-R DG Complete: No results found for this or any previous visit. Elbow-L DG Complete: No results found for this or any previous visit.  Wrist Imaging: Wrist-R DG Complete: No results found for this or any previous visit. Wrist-L DG Complete: No results found for this or any previous visit.  Hand Imaging: Hand-R DG Complete: No results found for this or any previous visit. Hand-L DG Complete: No results found for this or any previous visit.  Complexity Note: Imaging results reviewed. Results shared with Mr. Zinn, using Layman's terms.                         [place]  Drug: Mr. Hislop  reports no history of drug use. Alcohol:  reports no history of alcohol use. Tobacco:  reports that he has been smoking cigarettes. He has never used smokeless tobacco. Medical:  has a past medical history of Chronic kidney disease, Depression, Heart burn, HTN (hypertension), and  Prostatitis. Family: family history is not on file.  Past Surgical History:  Procedure Laterality Date  . APPENDECTOMY    . gall bladdder     Active Ambulatory Problems    Diagnosis Date Noted  . Anxiety state 11/25/2010  . Back ache 05/09/2012  . Barrett esophagus 07/17/2013  . Benign prostatic hyperplasia with urinary obstruction 01/18/2014  . Affective bipolar disorder ([place]) 04/14/2012  . Callus of foot 05/09/2012  . Back pain, chronic 07/24/2013  . Clinical depression 11/25/2010  . Acid reflux 11/25/2010  . Benign essential HTN 11/25/2010  . Abdominal pain, generalized 09/18/2013  . 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 Benign prostatic hypertrophy without urinary obstruction 11/25/2010  . Decreased potassium in the blood 07/18/2012  . 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 Myofascial pain 06/14/2013  . Arthritis, degenerative 11/25/2010  .  Abnormal toxicological findings 09/23/2012  . Impaired renal function 04/16/2011  . Compulsive tobacco user syndrome 11/25/2010  . BPH with obstruction/lower urinary tract symptoms 04/24/2015  . H/O urinary retention 04/24/2015  . Bipolar affective disorder ([place]) 04/14/2012  . Current tobacco use 11/25/2010   Resolved Ambulatory Problems    Diagnosis Date Noted  . No Resolved Ambulatory Problems   Past Medical History:  Diagnosis Date  . Chronic kidney disease   . Depression   . Heart burn   . HTN (hypertension)   . Prostatitis    Assessment  Primary Diagnosis & Pertinent Problem List: There were no encounter diagnoses.  Visit Diagnosis (New problems to examiner): No diagnosis found. Plan of Care (Initial workup plan)  Note: Mr. David was reminded that as per protocol, today's visit has been an evaluation only. We have not taken over the patient's controlled substance management.  Problem-specific plan: No problem-specific Assessment & Plan notes found for this encounter.  Lab Orders  No laboratory  test(s) ordered today   Imaging Orders  No imaging studies ordered today   Referral Orders  No referral(s) requested today   Procedure Orders    No procedure(s) ordered today   Pharmacotherapy (current): Medications ordered:  No orders of the defined types were placed in this encounter.  Medications administered during this visit: Dub Amis had no medications administered during this visit.   Pharmacological management options:  Opioid Analgesics: The patient was informed that there is no guarantee that he would be a candidate for opioid analgesics. The decision will be made following CDC guidelines. This decision will be based on the results of diagnostic studies, as well as Mr. Cortopassi risk profile.   Membrane stabilizer: To be determined at a later time  Muscle relaxant: To be determined at a later time  NSAID: To be determined at a later time  Other analgesic(s): To be determined at a later time   Interventional management options: Mr. Deeg was informed that there is no guarantee that he would be a candidate for interventional therapies. The decision will be based on the results of diagnostic studies, as well as Mr. Blyden risk profile.  Procedure(s) under consideration:  ***   Provider-requested follow-up: No follow-ups on file.  Future Appointments  Date Time Provider [place]  09/15/2018 11:15 AM Gillis Santa, MD [facility] None  09/27/2018 11:00 AM Gillis Santa, MD [facility] None    Total duration of non-face-to-face encounter: *** minutes.  Primary Care Physician: Gearldine Shown, DO Location: [facility] Note by: Gillis Santa, MD Date: 09/15/2018; Time: 12:03 PM

## 2018-09-15 ENCOUNTER — Ambulatory Visit: Payer: Medicare Other | Admitting: Student in an Organized Health Care Education/Training Program

## 2018-09-15 ENCOUNTER — Other Ambulatory Visit: Payer: Self-pay

## 2018-09-15 ENCOUNTER — Telehealth: Payer: Self-pay

## 2018-09-15 NOTE — Telephone Encounter (Signed)
Second attempt to reach new patient, no answer and message left on answering service.

## 2018-09-22 NOTE — Progress Notes (Deleted)
Patient's Name: [name] Gray  MRN: [id number]  Referring Provider: Garald Balding*  DOB: 12-Mar-1951  PCP: Gearldine Shown, DO  DOS: 09/27/2018  Note by: Gillis Santa, MD  Service setting: Ambulatory outpatient  Specialty: Interventional Pain Management  Location: [facility] Virtual Visit  Visit type: Initial Patient Evaluation  Patient type: New Patient   Pain Management Virtual Encounter Note - Virtual Visit via [place] (real-time audio visits between healthcare provider and patient).  Patient's Phone No.:  [phone] (home); There is no such number on file (mobile).; (Preferred) [phone] No e-mail address on record  [place], [place] - [address] [place] [address] Phone: [phone] Fax: [phone]   Pre-screening note:  Our staff contacted [name] Gray and offered him an "in person", "face-to-face" appointment versus a telephone encounter. He indicated preferring the telephone encounter, at this time.    Primary Reason(s) for Visit: Tele-Encounter for initial evaluation of one or more chronic problems (new to examiner) potentially causing chronic pain, and posing a threat to normal musculoskeletal function. (Level of risk: High) CC: Back Pain (low)  I contacted [name] Gray on 09/27/2018 at 10:58 AM via video conference.      I clearly identified myself as Gillis Santa, MD. I verified that I was speaking with the correct person using two identifiers (Name and date of birth: October 19, 1950).  Advanced Informed Consent I sought verbal advanced consent from [name] Gray for virtual visit interactions. I informed [name] Gray of possible security and privacy concerns, risks, and limitations associated with providing "not-in-person" medical evaluation and management services. I also informed [name] Gray of the availability of "in-person" appointments. Finally, I informed him  that there would be a charge for the virtual visit and that he could be  personally, fully or partially, financially responsible for it. [name] Gray expressed understanding and agreed to proceed.   HPI  [name] Gray is a 68 y.o. year old, male patient, contacted today for an initial evaluation of his chronic pain. He has Anxiety state; Back ache; Barrett esophagus; Benign prostatic hyperplasia with urinary obstruction; Affective bipolar disorder ([place]); Callus of foot; Back pain, chronic; Clinical depression; Acid reflux; Benign essential HTN; Abdominal pain, generalized; HCV antibody positive; Benign prostatic hypertrophy without urinary obstruction; Decreased potassium in the blood; Degenerative arthritis of lumbar spine; Myofascial pain; Arthritis, degenerative; Abnormal toxicological findings; Impaired renal function; Compulsive tobacco user syndrome; BPH with obstruction/lower urinary tract symptoms; H/O urinary retention; Bipolar affective disorder ([place]); and Current tobacco use on their problem list.  Pain Assessment: Location: Lower Back Radiating: radiates down back of legs Onset: More than a month ago Duration:   Quality: Stabbing Severity:  /10 (subjective, self-reported pain score)  Effect on ADL: limits activities Timing: Intermittent Modifying factors: denies  Onset and Duration: Sudden and Date of onset: more than a couple of years Cause of pain: fell on pelvis bone Severity: Getting worse, NAS-11 at its worse: 9/10, NAS-11 at its best: 4/10, NAS-11 now: 7/10 and NAS-11 on the average: 7/10 Timing: Not influenced by the time of the day Aggravating Factors: Bending, Lifiting, Motion, Squatting, Stooping , Twisting, Walking and house work Alleviating Factors: Medications and oxycodone '5mg'$  bid Associated Problems: Tingling Quality of Pain: Intermittent and Stabbing Previous Examinations or Tests: X-rays, Neurosurgical evaluation and Orthopedic evaluation Previous Treatments: Narcotic  medications and Physical Therapy   The patient was informed that my practice is divided into two sections: an interventional  pain management section, as well as a completely separate and distinct medication management section. I explained that I have procedure days for my interventional therapies, and evaluation days for follow-ups and medication management. Because of the amount of documentation required during both, they are kept separated. This means that there is the possibility that he may be scheduled for a procedure on one day, and medication management the next. I have also informed him that because of staffing and facility limitations, I no longer take patients for medication management only. To illustrate the reasons for this, I gave the patient the example of surgeons, and how inappropriate it would be to refer a patient to his/her care, just to write for the post-surgical antibiotics on a surgery done by a different surgeon.   Because interventional pain management is my board-certified specialty, the patient was informed that joining my practice means that they are open to any and all interventional therapies. I made it clear that this does not mean that they will be forced to have any procedures done. What this means is that I believe interventional therapies to be essential part of the diagnosis and proper management of chronic pain conditions. Therefore, patients not interested in these interventional alternatives will be better served under the care of a different practitioner.  The patient was also made aware of my Comprehensive Pain Management Safety Guidelines where by joining my practice, they limit all of their nerve blocks and joint injections to those done by our practice, for as long as we are retained to manage their care.   Historic Controlled Substance Pharmacotherapy Review  PMP and historical list of controlled substances: ***  Highest opioid analgesic regimen found: ***   Most recent opioid analgesic: ***  Current opioid analgesics: ***  Highest recorded MME/day: *** mg/day MME/day: *** mg/day Medications: Bottles not available for inspection. Pharmacodynamics: Desired effects: Analgesia: The patient reports >50% benefit. Reported improvement in function: The patient reports medication allows him to accomplish basic ADLs. Clinically meaningful improvement in function (CMIF): Sustained CMIF goals met Perceived effectiveness: Described as relatively effective, allowing for increase in activities of daily living (ADL) Undesirable effects: Side-effects or Adverse reactions: None reported Historical Monitoring: The patient  reports no history of drug use. List of all UDS Test(s): No results found for: MDMA, COCAINSCRNUR, [place], [place], CANNABQUANT, [place], [place] List of other Serum/Urine Drug Screening Test(s):  No results found for: AMPHSCRSER, BARBSCRSER, BENZOSCRSER, COCAINSCRSER, COCAINSCRNUR, PCPSCRSER, PCPQUANT, THCSCRSER, THCU, CANNABQUANT, OPIATESCRSER, OXYSCRSER, PROPOXSCRSER, ETH Historical Background Evaluation: [place] PMP: PDMP not reviewed this encounter. Six (6) year initial data search conducted.             PMP NARX Score Report:  Narcotic: *** Sedative: *** Stimulant: *** [place] Department of public safety, offender search: Editor, commissioning Information) Non-contributory Risk Assessment Profile: Aberrant behavior: None observed or detected today Risk factors for fatal opioid overdose: None identified today PMP NARX Overdose Risk Score: *** Fatal overdose hazard ratio (HR): Calculation deferred Non-fatal overdose hazard ratio (HR): Calculation deferred Risk of opioid abuse or dependence: 0.7-3.0% with doses ? 36 MME/day and 6.1-26% with doses ? 120 MME/day. Substance use disorder (SUD) risk level: See below Personal History of Substance Abuse (SUD-Substance use disorder):  Alcohol: Negative  Illegal Drugs: Negative  Rx Drugs: Negative  ORT Risk  Level calculation: Low Risk Opioid Risk Tool - 09/22/18 1104      Family History of Substance Abuse   Alcohol  Negative    Illegal Drugs  Negative  Rx Drugs  Negative      Personal History of Substance Abuse   Alcohol  Negative    Illegal Drugs  Negative    Rx Drugs  Negative      Age   Age between 97-45 years   No      History of Preadolescent Sexual Abuse   History of Preadolescent Sexual Abuse  Negative or Male      Psychological Disease   Psychological Disease  Negative    Depression  Positive      Total Score   Opioid Risk Tool Scoring  1    Opioid Risk Interpretation  Low Risk      ORT Scoring interpretation table:  Score <3 = Low Risk for SUD  Score between 4-7 = Moderate Risk for SUD  Score >8 = High Risk for Opioid Abuse   Pharmacologic Plan: As per protocol, I have not taken over any controlled substance management, pending the results of ordered tests and/or consults.            Initial impression: Pending review of available data and ordered tests.  Meds   Current Outpatient Medications:  .  amLODipine (NORVASC) 10 MG tablet, Take 10 mg by mouth., Disp: , Rfl:  .  carisoprodol (SOMA) 350 MG tablet, Take 1 tablet (350 mg total) by mouth 3 (three) times daily., Disp: 15 tablet, Rfl: 0 .  citalopram (CELEXA) 40 MG tablet, Take 40 mg by mouth., Disp: , Rfl:  .  doxazosin (CARDURA) 4 MG tablet, Take 1 tablet (4 mg total) by mouth daily., Disp: 30 tablet, Rfl: 12 .  etodolac (LODINE) 200 MG capsule, Take 1 capsule (200 mg total) by mouth every 8 (eight) hours., Disp: 12 capsule, Rfl: 0 .  hydrochlorothiazide (HYDRODIURIL) 25 MG tablet, Take 25 mg by mouth., Disp: , Rfl:  .  lamoTRIgine (LAMICTAL) 100 MG tablet, Take 100 mg by mouth., Disp: , Rfl:  .  lisinopril (PRINIVIL,ZESTRIL) 40 MG tablet, Take 40 mg by mouth., Disp: , Rfl:  .  metoprolol (LOPRESSOR) 50 MG tablet, Take by mouth., Disp: , Rfl:  .  Multiple Vitamin (MULTI-VITAMINS) TABS, Take by mouth., Disp:  , Rfl:  .  oxyCODONE (OXY IR/ROXICODONE) 5 MG immediate release tablet, Take by mouth., Disp: , Rfl:  .  erythromycin ophthalmic ointment, Place a 1/2 inch ribbon of ointment into the lower eyelid. (Patient not taking: Reported on 09/07/2018), Disp: 1 g, Rfl: 0  ROS  Cardiovascular: Daily Aspirin intake and High blood pressure Pulmonary or Respiratory: Smoking Neurological: No reported neurological signs or symptoms such as seizures, abnormal skin sensations, urinary and/or fecal incontinence, being born with an abnormal open spine and/or a tethered spinal cord Review of Past Neurological Studies: No results found for this or any previous visit. Psychological-Psychiatric: Depressed and Suicidal ideations Gastrointestinal: Reflux or heatburn Genitourinary: Kidney disease stage 3 kidney disease Hematological: No reported hematological signs or symptoms such as prolonged bleeding, low or poor functioning platelets, bruising or bleeding easily, hereditary bleeding problems, low energy levels due to low hemoglobin or being anemic Endocrine: No reported endocrine signs or symptoms such as high or low blood sugar, rapid heart rate due to high thyroid levels, obesity or weight gain due to slow thyroid or thyroid disease Rheumatologic: Generalized muscle aches (Fibromyalgia) Musculoskeletal: Negative for myasthenia gravis, muscular dystrophy, multiple sclerosis or malignant hyperthermia Work History: Disabled  Allergies  Mr. Sanden is allergic to latex; antihistamines, chlorpheniramine-type; and tramadol.  Laboratory Chemistry  Inflammation Markers (  CRP: Acute Phase) (ESR: Chronic Phase) No results found for: CRP, ESRSEDRATE, LATICACIDVEN                       Rheumatology Markers No results found for: RF, ANA, LABURIC, URICUR, LYMEIGGIGMAB, LYMEABIGMQN, HLAB27                      Renal Function Markers Lab Results  Component Value Date   BUN 24 (H) 11/20/2013   CREATININE 2.15 (H) 11/20/2013    GFRAA 37 (L) 11/20/2013   GFRNONAA 32 (L) 11/20/2013                             Hepatic Function Markers No results found for: AST, ALT, ALBUMIN, ALKPHOS, HCVAB, AMYLASE, LIPASE, AMMONIA                      Electrolytes Lab Results  Component Value Date   NA 140 11/20/2013   K 3.1 (L) 11/20/2013   CL 103 11/20/2013   CALCIUM 8.8 11/20/2013                        Neuropathy Markers No results found for: VITAMINB12, FOLATE, HGBA1C, HIV                      CNS Tests No results found for: COLORCSF, APPEARCSF, RBCCOUNTCSF, WBCCSF, POLYSCSF, LYMPHSCSF, EOSCSF, PROTEINCSF, GLUCCSF, JCVIRUS, CSFOLI, IGGCSF, LABACHR, ACETBL                      Bone Pathology Markers No results found for: [place], [id number], [id number], [id number], 25OHVITD1, 25OHVITD2, 25OHVITD3, TESTOFREE, TESTOSTERONE                       Coagulation Parameters Lab Results  Component Value Date   PLT 182 11/20/2013                        Cardiovascular Markers Lab Results  Component Value Date   TROPONINI < 0.02 11/20/2013   HGB 14.5 11/20/2013   HCT 42.8 11/20/2013                         ID Markers No results found for: LYMEIGGIGMAB, HIV                      CA Markers No results found for: CEA, CA125, LABCA2                      Endocrine Markers No results found for: TSH, FREET4, TESTOFREE, TESTOSTERONE, SHBG, ESTRADIOL, ESTRADIOLPCT, ESTRADIOLFRE, LABPREG, ACTH                      Note: Lab results reviewed.  Imaging Review  Cervical Imaging: Cervical MR wo contrast: No results found for this or any previous visit. Cervical MR wo contrast: No procedure found. Cervical MR w/wo contrast: No results found for this or any previous visit. Cervical MR w contrast: No results found for this or any previous visit. Cervical CT wo contrast: No results found for this or any previous visit. Cervical CT w/wo contrast: No results found for this or any previous visit. Cervical CT w/wo contrast: No  results found for this  or any previous visit. Cervical CT w contrast: No results found for this or any previous visit. Cervical CT outside: No results found for this or any previous visit. Cervical DG 1 view: No results found for this or any previous visit. Cervical DG 2-3 views: No results found for this or any previous visit. Cervical DG F/E views: No results found for this or any previous visit. Cervical DG 2-3 clearing views: No results found for this or any previous visit. Cervical DG Bending/F/E views: No results found for this or any previous visit. Cervical DG complete: No results found for this or any previous visit. Cervical DG Myelogram views: No results found for this or any previous visit. Cervical DG Myelogram views: No results found for this or any previous visit. Cervical Discogram views: No results found for this or any previous visit.  Shoulder Imaging: Shoulder-R MR w contrast: No results found for this or any previous visit. Shoulder-L MR w contrast: No results found for this or any previous visit. Shoulder-R MR w/wo contrast: No results found for this or any previous visit. Shoulder-L MR w/wo contrast: No results found for this or any previous visit. Shoulder-R MR wo contrast: No results found for this or any previous visit. Shoulder-L MR wo contrast: No results found for this or any previous visit. Shoulder-R CT w contrast: No results found for this or any previous visit. Shoulder-L CT w contrast: No results found for this or any previous visit. Shoulder-R CT w/wo contrast: No results found for this or any previous visit. Shoulder-L CT w/wo contrast: No results found for this or any previous visit. Shoulder-R CT wo contrast: No results found for this or any previous visit. Shoulder-L CT wo contrast: No results found for this or any previous visit. Shoulder-R DG Arthrogram: No results found for this or any previous visit. Shoulder-L DG Arthrogram: No results found for  this or any previous visit. Shoulder-R DG 1 view: No results found for this or any previous visit. Shoulder-L DG 1 view: No results found for this or any previous visit. Shoulder-R DG: No results found for this or any previous visit. Shoulder-L DG: No results found for this or any previous visit.  Thoracic Imaging: Thoracic MR wo contrast: No results found for this or any previous visit. Thoracic MR wo contrast: No procedure found. Thoracic MR w/wo contrast: No results found for this or any previous visit. Thoracic MR w contrast: No results found for this or any previous visit. Thoracic CT wo contrast: No results found for this or any previous visit. Thoracic CT w/wo contrast: No results found for this or any previous visit. Thoracic CT w/wo contrast: No results found for this or any previous visit. Thoracic CT w contrast: No results found for this or any previous visit. Thoracic DG 2-3 views: No results found for this or any previous visit. Thoracic DG 4 views: No results found for this or any previous visit. Thoracic DG: No results found for this or any previous visit. Thoracic DG w/swimmers view: No results found for this or any previous visit. Thoracic DG Myelogram views: No results found for this or any previous visit. Thoracic DG Myelogram views: No results found for this or any previous visit.  Lumbosacral Imaging: Lumbar MR wo contrast: No results found for this or any previous visit. Lumbar MR wo contrast: No procedure found. Lumbar MR w/wo contrast: No results found for this or any previous visit. Lumbar MR w/wo contrast: No results found for this or  any previous visit. Lumbar MR w contrast: No results found for this or any previous visit. Lumbar CT wo contrast: No results found for this or any previous visit. Lumbar CT w/wo contrast: No results found for this or any previous visit. Lumbar CT w/wo contrast: No results found for this or any previous visit. Lumbar CT w  contrast: No results found for this or any previous visit. Lumbar DG 1V: No results found for this or any previous visit. Lumbar DG 1V (Clearing): No results found for this or any previous visit. Lumbar DG 2-3V (Clearing): No results found for this or any previous visit. Lumbar DG 2-3 views: No results found for this or any previous visit. Lumbar DG (Complete) 4+V: No results found for this or any previous visit.       Lumbar DG F/E views: No results found for this or any previous visit.       Lumbar DG Bending views: No results found for this or any previous visit.       Lumbar DG Myelogram views: No results found for this or any previous visit. Lumbar DG Myelogram: No results found for this or any previous visit. Lumbar DG Myelogram: No results found for this or any previous visit. Lumbar DG Myelogram: No results found for this or any previous visit. Lumbar DG Myelogram Lumbosacral: No results found for this or any previous visit. Lumbar DG Diskogram views: No results found for this or any previous visit. Lumbar DG Diskogram views: No results found for this or any previous visit. Lumbar DG Epidurogram OP: No results found for this or any previous visit. Lumbar DG Epidurogram IP: No results found for this or any previous visit.  Sacroiliac Joint Imaging: Sacroiliac Joint DG: No results found for this or any previous visit. Sacroiliac Joint MR w/wo contrast: No results found for this or any previous visit. Sacroiliac Joint MR wo contrast: No results found for this or any previous visit.  Spine Imaging: Whole Spine DG Myelogram views: No results found for this or any previous visit. Whole Spine MR Mets screen: No results found for this or any previous visit. Whole Spine MR Mets screen: No results found for this or any previous visit. Whole Spine MR w/wo: No results found for this or any previous visit. MRA Spinal Canal w/ cm: No results found for this or any previous visit. MRA Spinal  Canal wo/ cm: No procedure found. MRA Spinal Canal w/wo cm: No results found for this or any previous visit. Spine Outside MR Films: No results found for this or any previous visit. Spine Outside CT Films: No results found for this or any previous visit. CT-Guided Biopsy: No results found for this or any previous visit. CT-Guided Needle Placement: No results found for this or any previous visit. DG Spine outside: No results found for this or any previous visit. IR Spine outside: No results found for this or any previous visit. NM Spine outside: No results found for this or any previous visit.  Hip Imaging: Hip-R MR w contrast: No results found for this or any previous visit. Hip-L MR w contrast: No results found for this or any previous visit. Hip-R MR w/wo contrast: No results found for this or any previous visit. Hip-L MR w/wo contrast: No results found for this or any previous visit. Hip-R MR wo contrast: No results found for this or any previous visit. Hip-L MR wo contrast: No results found for this or any previous visit. Hip-R CT w contrast:  No results found for this or any previous visit. Hip-L CT w contrast: No results found for this or any previous visit. Hip-R CT w/wo contrast: No results found for this or any previous visit. Hip-L CT w/wo contrast: No results found for this or any previous visit. Hip-R CT wo contrast: No results found for this or any previous visit. Hip-L CT wo contrast: No results found for this or any previous visit. Hip-R DG 2-3 views: No results found for this or any previous visit. Hip-L DG 2-3 views:  Results for orders placed during the hospital encounter of 01/13/16  DG Hip Unilat With Pelvis 2-3 Views Left   Narrative CLINICAL DATA:  Slip on floor getting out of the shower six days prior, now with left hip pain radiating down left leg.  EXAM: DG HIP (WITH OR WITHOUT PELVIS) 2-3V LEFT  COMPARISON:  None.  FINDINGS: The cortical margins of the  bony pelvis and left hip are intact. No fracture. Mild bilateral hip degenerative change. Pubic symphysis and sacroiliac joints are congruent. Both femoral heads are well-seated in the respective acetabula.  IMPRESSION: No fracture or subluxation of the pelvis or left hip.   Electronically Signed   By: Jeb Levering M.D.   On: 01/13/2016 02:31    Hip-R DG Arthrogram: No results found for this or any previous visit. Hip-L DG Arthrogram: No results found for this or any previous visit. Hip-B DG Bilateral: No results found for this or any previous visit.  Knee Imaging: Knee-R MR w contrast: No results found for this or any previous visit. Knee-L MR w contrast: No results found for this or any previous visit. Knee-R MR w/wo contrast: No results found for this or any previous visit. Knee-L MR w/wo contrast: No results found for this or any previous visit. Knee-R MR wo contrast: No results found for this or any previous visit. Knee-L MR wo contrast: No results found for this or any previous visit. Knee-R CT w contrast: No results found for this or any previous visit. Knee-L CT w contrast: No results found for this or any previous visit. Knee-R CT w/wo contrast: No results found for this or any previous visit. Knee-L CT w/wo contrast: No results found for this or any previous visit. Knee-R CT wo contrast: No results found for this or any previous visit. Knee-L CT wo contrast: No results found for this or any previous visit. Knee-R DG 1-2 views: No results found for this or any previous visit. Knee-L DG 1-2 views: No results found for this or any previous visit. Knee-R DG 3 views: No results found for this or any previous visit. Knee-L DG 3 views: No results found for this or any previous visit. Knee-R DG 4 views: No results found for this or any previous visit. Knee-L DG 4 views: No results found for this or any previous visit. Knee-R DG Arthrogram: No results found for this or any  previous visit. Knee-L DG Arthrogram: No results found for this or any previous visit.  Ankle Imaging: Ankle-R DG Complete: No results found for this or any previous visit. Ankle-L DG Complete: No results found for this or any previous visit.  Foot Imaging: Foot-R DG Complete: No results found for this or any previous visit. Foot-L DG Complete: No results found for this or any previous visit.  Elbow Imaging: Elbow-R DG Complete: No results found for this or any previous visit. Elbow-L DG Complete: No results found for this or any previous visit.  Wrist Imaging: Wrist-R DG Complete: No results found for this or any previous visit. Wrist-L DG Complete: No results found for this or any previous visit.  Hand Imaging: Hand-R DG Complete: No results found for this or any previous visit. Hand-L DG Complete: No results found for this or any previous visit.  Complexity Note: Imaging results reviewed. Results shared with Mr. Bazar, using Layman's terms.                         [place]  Drug: Mr. Montelongo  reports no history of drug use. Alcohol:  reports no history of alcohol use. Tobacco:  reports that he has been smoking cigarettes. He has never used smokeless tobacco. Medical:  has a past medical history of Chronic kidney disease, Depression, Heart burn, HTN (hypertension), and Prostatitis. Family: family history is not on file.  Past Surgical History:  Procedure Laterality Date  . APPENDECTOMY    . gall bladdder     Active Ambulatory Problems    Diagnosis Date Noted  . Anxiety state 11/25/2010  . Back ache 05/09/2012  . Barrett esophagus 07/17/2013  . Benign prostatic hyperplasia with urinary obstruction 01/18/2014  . Affective bipolar disorder ([place]) 04/14/2012  . Callus of foot 05/09/2012  . Back pain, chronic 07/24/2013  . Clinical depression 11/25/2010  . Acid reflux 11/25/2010  . Benign essential HTN 11/25/2010  . Abdominal pain, generalized 09/18/2013  . 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 Benign prostatic hypertrophy without urinary obstruction 11/25/2010  . Decreased potassium in the blood 07/18/2012  . 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 Myofascial pain 06/14/2013  . Arthritis, degenerative 11/25/2010  . Abnormal toxicological findings 09/23/2012  . Impaired renal function 04/16/2011  . Compulsive tobacco user syndrome 11/25/2010  . BPH with obstruction/lower urinary tract symptoms 04/24/2015  . H/O urinary retention 04/24/2015  . Bipolar affective disorder ([place]) 04/14/2012  . Current tobacco use 11/25/2010   Resolved Ambulatory Problems    Diagnosis Date Noted  . No Resolved Ambulatory Problems   Past Medical History:  Diagnosis Date  . Chronic kidney disease   . Depression   . Heart burn   . HTN (hypertension)   . Prostatitis    Assessment  Primary Diagnosis & Pertinent Problem List: There were no encounter diagnoses.  Visit Diagnosis (New problems to examiner): No diagnosis found. Plan of Care (Initial workup plan)  Note: Mr. Meinders was reminded that as per protocol, today's visit has been an evaluation only. We have not taken over the patient's controlled substance management.  Problem-specific plan: No problem-specific Assessment & Plan notes found for this encounter.  Lab Orders  No laboratory test(s) ordered today   Imaging Orders  No imaging studies ordered today   Referral Orders  No referral(s) requested today   Procedure Orders    No procedure(s) ordered today   Pharmacotherapy (current): Medications ordered:  No orders of the defined types were placed in this encounter.  Medications administered during this visit: Dub Amis had no medications administered during this visit.   Pharmacological management options:  Opioid Analgesics: The patient was informed that there is no guarantee that he would be a candidate for opioid analgesics. The decision will be made following CDC guidelines.  This decision will be based on the results of diagnostic studies, as well as Mr. Sheley risk profile.   Membrane stabilizer: To be determined at a later time  Muscle relaxant: To be determined at a  later time  NSAID: To be determined at a later time  Other analgesic(s): To be determined at a later time   Interventional management options: Mr. Hanna was informed that there is no guarantee that he would be a candidate for interventional therapies. The decision will be based on the results of diagnostic studies, as well as Mr. Woon risk profile.  Procedure(s) under consideration:  ***   Provider-requested follow-up: No follow-ups on file.  Future Appointments  Date Time Provider [place]  09/27/2018 11:00 AM Gillis Santa, MD [facility] None    Total duration of non-face-to-face encounter: *** minutes.  Primary Care Physician: Gearldine Shown, DO Location: [facility] Note by: Gillis Santa, MD Date: 09/27/2018; Time: 10:58 AM  Note: This dictation was prepared with Dragon dictation. Any transcriptional errors that may result from this process are unintentional.

## 2018-09-27 ENCOUNTER — Other Ambulatory Visit: Payer: Self-pay

## 2018-09-27 ENCOUNTER — Ambulatory Visit (HOSPITAL_BASED_OUTPATIENT_CLINIC_OR_DEPARTMENT_OTHER): Payer: Medicare Other | Admitting: Student in an Organized Health Care Education/Training Program

## 2018-09-27 DIAGNOSIS — G894 Chronic pain syndrome: Secondary | ICD-10-CM

## 2018-10-21 ENCOUNTER — Emergency Department (HOSPITAL_COMMUNITY)
Admission: EM | Admit: 2018-10-21 | Discharge: 2018-10-21 | Disposition: A | Discharge status: Home / Self Care | Payer: Medicare Other | Attending: Emergency Medicine | Admitting: Emergency Medicine

## 2018-10-21 ENCOUNTER — Other Ambulatory Visit: Payer: Self-pay

## 2018-10-21 ENCOUNTER — Encounter (HOSPITAL_COMMUNITY): Payer: Self-pay | Admitting: Emergency Medicine

## 2018-10-21 DIAGNOSIS — F17210 Nicotine dependence, cigarettes, uncomplicated: Secondary | ICD-10-CM | POA: Diagnosis not present

## 2018-10-21 DIAGNOSIS — Z79899 Other long term (current) drug therapy: Secondary | ICD-10-CM | POA: Insufficient documentation

## 2018-10-21 DIAGNOSIS — I129 Hypertensive chronic kidney disease with stage 1 through stage 4 chronic kidney disease, or unspecified chronic kidney disease: Secondary | ICD-10-CM | POA: Insufficient documentation

## 2018-10-21 DIAGNOSIS — M5442 Lumbago with sciatica, left side: Secondary | ICD-10-CM | POA: Insufficient documentation

## 2018-10-21 DIAGNOSIS — N189 Chronic kidney disease, unspecified: Secondary | ICD-10-CM | POA: Diagnosis not present

## 2018-10-21 DIAGNOSIS — Z91040 Latex allergy status: Secondary | ICD-10-CM | POA: Diagnosis not present

## 2018-10-21 DIAGNOSIS — M5441 Lumbago with sciatica, right side: Secondary | ICD-10-CM | POA: Diagnosis not present

## 2018-10-21 DIAGNOSIS — G8929 Other chronic pain: Secondary | ICD-10-CM | POA: Diagnosis not present

## 2018-10-21 DIAGNOSIS — M549 Dorsalgia, unspecified: Secondary | ICD-10-CM | POA: Diagnosis present

## 2018-10-21 MED ORDER — OXYCODONE HCL 5 MG PO TABS: 5.0000 mg | ORAL_TABLET | Freq: Two times a day (BID) | ORAL | 0 refills | Status: AC | PRN

## 2018-10-21 MED ORDER — OXYCODONE HCL 5 MG PO TABS
5.0000 mg | ORAL_TABLET | Freq: Once | ORAL | Status: AC
  Administered 2018-10-21: 5 mg via ORAL
  Filled 2018-10-21: qty 1

## 2018-10-21 NOTE — ED Provider Notes (Signed)
[facility] EMERGENCY DEPARTMENT Provider Note   CSN: [id number] Arrival date & time: 10/21/18  1922    History   Chief Complaint Chief Complaint  Patient presents with  . Back Pain    HPI [name] Gray is a 68 y.o. male with history of CKD, depression, hypertension, BPH presenting for evaluation of chronic back pain.  He reports that he has been on oxycodone for years but recently relocated from medicine to [place].  He states that as a result of the COVID-19 pandemic, many of his appointments have been canceled or rescheduled and the majority of them have been held via telemedicine platform.  He states that his PCP that he had seen for years recently left the practice and so he is started to see a new PCP within the group who switched him from oxycodone to hydrocodone with Tylenol.  He states that he also had an appointment set up to establish care with a pain management facility but they also rescheduled his appointment August.  He reports he has been out of his oxycodone and his pain has been flaring up.  Pain is mostly midline but also radiates to the left side of the low back.  No radiation of pain down the extremities.  Denies bowel or bladder incontinence, saddle anesthesia, fevers, or history of IV drug use.  Reports feeling somewhat frustrated and verbalizes intention to establish care with a PCP and a pain management facility more local to here. He states he has not signed a pain contract.  Per chart review he has an appointment to see his PCP via telemedicine platform on Monday.  He also reports frustration that his medication options are limited due to his CKD and other comorbidities.     The history is provided by the patient.    Past Medical History:  Diagnosis Date  . Chronic kidney disease   . Depression   . Heart burn   . HTN (hypertension)   . Prostatitis     Patient Active Problem List   Diagnosis Date Noted  . BPH with obstruction/lower  urinary tract symptoms 04/24/2015  . H/O urinary retention 04/24/2015  . 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 Benign prostatic hyperplasia with urinary obstruction 01/18/2014  . Abdominal pain, generalized 09/18/2013  . Back pain, chronic 07/24/2013  . Barrett esophagus 07/17/2013  . 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 Myofascial pain 06/14/2013  . Abnormal toxicological findings 09/23/2012  . Decreased potassium in the blood 07/18/2012  . Back ache 05/09/2012  . Callus of foot 05/09/2012  . Affective bipolar disorder ([place]) 04/14/2012  . Bipolar affective disorder ([place]) 04/14/2012  . Impaired renal function 04/16/2011  . Anxiety state 11/25/2010  . Clinical depression 11/25/2010  . Acid reflux 11/25/2010  . Benign essential HTN 11/25/2010  . Benign prostatic hypertrophy without urinary obstruction 11/25/2010  . Arthritis, degenerative 11/25/2010  . Compulsive tobacco user syndrome 11/25/2010  . Current tobacco use 11/25/2010    Past Surgical History:  Procedure Laterality Date  . APPENDECTOMY    . gall bladdder          Home Medications    Prior to Admission medications   Medication Sig Start Date End Date Taking? Authorizing Provider  amLODipine (NORVASC) 10 MG tablet Take 10 mg by mouth.    [provider]  carisoprodol (SOMA) 350 MG tablet Take 1 tablet (350 mg total) by mouth 3 (three) times daily. 07/13/16   Lorin Picket, PA-C  citalopram (CELEXA) 40 MG tablet Take 40 mg by mouth.    [provider]  doxazosin (CARDURA) 4 MG tablet Take 1 tablet (4 mg total) by mouth daily. 04/24/15   Michiel CowboyMcGowan, Shannon A, PA-C  erythromycin ophthalmic ointment Place a 1/2 inch ribbon of ointment into the lower eyelid. Patient not taking: Reported on 09/07/2018 07/13/16   Lutricia Feiloemer, [name] P, PA-C  etodolac (LODINE) 200 MG capsule Take 1 capsule (200 mg total) by mouth every 8 (eight) hours. 01/13/16   Rebecka ApleyWebster, Allison P, MD  hydrochlorothiazide  (HYDRODIURIL) 25 MG tablet Take 25 mg by mouth.    [provider]  lamoTRIgine (LAMICTAL) 100 MG tablet Take 100 mg by mouth.    [provider]  lisinopril (PRINIVIL,ZESTRIL) 40 MG tablet Take 40 mg by mouth.    [provider]  metoprolol (LOPRESSOR) 50 MG tablet Take by mouth. 09/19/14 09/22/18  [provider]  Multiple Vitamin (MULTI-VITAMINS) TABS Take by mouth.    [provider]  oxyCODONE (ROXICODONE) 5 MG immediate release tablet Take 1 tablet (5 mg total) by mouth every 12 (twelve) hours as needed for severe pain. 10/21/18   Jeanie SewerFawze, Arthea Nobel A, PA-C    Family History Family History  Problem Relation Age of Onset  . Kidney disease Neg Hx   . Prostate cancer Neg Hx     Social History Social History   Tobacco Use  . Smoking status: Current Every Day Smoker    Types: Cigarettes  . Smokeless tobacco: Never Used  Substance Use Topics  . Alcohol use: No    Alcohol/week: 0.0 standard drinks  . Drug use: No     Allergies   Latex; Antihistamines, chlorpheniramine-type; and Tramadol   Review of Systems Review of Systems  Constitutional: Negative for chills and fever.  Gastrointestinal: Negative for abdominal pain, nausea and vomiting.  Genitourinary: Negative for decreased urine volume, dysuria and hematuria.  Musculoskeletal: Positive for back pain.  Neurological: Negative for weakness and numbness.  All other systems reviewed and are negative.    Physical Exam Updated Vital Signs BP (!) 158/94 (BP Location: Right Arm)   Pulse 84   Temp 98.6 F (37 C) (Oral)   Resp 16   Ht 5\' 9"  (1.753 m)   Wt 86.2 kg   SpO2 100%   BMI 28.06 kg/m   Physical Exam Vitals signs and nursing note reviewed.  Constitutional:      General: He is not in acute distress.    Appearance: He is well-developed.  HENT:     Head: Normocephalic and atraumatic.  Eyes:     General:        Right eye: No discharge.        Left eye: No discharge.      Conjunctiva/sclera: Conjunctivae normal.  Neck:     Vascular: No JVD.     Trachea: No tracheal deviation.  Cardiovascular:     Rate and Rhythm: Normal rate.     Pulses: Normal pulses.     Comments: 2+ DP/PT pulses bilaterally, no lower extremity edema, Homans sign absent Pulmonary:     Effort: Pulmonary effort is normal.  Abdominal:     General: There is no distension.  Musculoskeletal:     Comments: No midline lumbar spine tenderness, left paralumbar muscle tenderness noted.  No deformity, crepitus, or step-off.  5/5 strength of BLE major muscle groups.  Decreased range of motion with flexion and extension of the lumbar spine with pain elicited with extension.  Negative straight leg raise bilaterally.  Skin:    General: Skin is warm.     Findings: No erythema.  Neurological:     Mental Status: He is alert.     Comments: Fluent speech, no facial droop, sensation intact to soft touch of bilateral lower extremities.  Ambulatory with mildly antalgic gait, unable to heel walk or toe walk without assistance.  Psychiatric:        Behavior: Behavior normal.      ED Treatments / Results  Labs (all labs ordered are listed, but only abnormal results are displayed) Labs Reviewed - No data to display  EKG None  Radiology No results found.  Procedures Procedures (including critical care time)  Medications Ordered in ED Medications  oxyCODONE (Oxy IR/ROXICODONE) immediate release tablet 5 mg (has no administration in time range)     Initial Impression / Assessment and Plan / ED Course  I have reviewed the triage vital signs and the nursing notes.  Pertinent labs & imaging results that were available during my care of the patient were reviewed by me and considered in my medical decision making (see chart for details).        Patient presenting for evaluation of his back pain.  This is chronic in nature, waxes and wanes.  He has had some difficulty following up with his PCP and  establishing care with a pain management specialist due to the COVID-19 pandemic.  He is afebrile, mildly hypertensive in the ED.  Vital signs are otherwise stable.  He is nontoxic in appearance.  He is neurovascularly intact.  Abdomen is soft and nontender, doubt dissection.  No red flag signs concerning for cauda equina or spinal abscess and he is ambulatory in the ED despite pain.  We discussed that we do not typically prescribe refills for chronic pain medications but under the circumstances we can provide a few tablets to get him to his visit to his PCP on Monday.  [place] controlled substance registry was queried.  He understands that we will not prescribe any further narcotic pain medicines in the ED for chronic complaints.  We also discussed numerous nonnarcotic options, which the patient declined stating "I have tried all of those and they have not been very effective ".  I did encourage the patient to find a PCP and pain management specialist nearby and gave him resources for follow-up.  Discussed strict ED return precautions. Patient verbalized understanding of and agreement with plan and is safe for discharge home at this time.   Final Clinical Impressions(s) / ED Diagnoses   Final diagnoses:  Chronic bilateral low back pain with bilateral sciatica    ED Discharge Orders         Ordered    oxyCODONE (ROXICODONE) 5 MG immediate release tablet  Every 12 hours PRN     10/21/18 2028           Bennye AlmFawze, Arlethia Basso A, PA-C 10/21/18 2034    Jacalyn LefevreHaviland, Julie, MD 10/21/18 [id number]

## 2018-10-21 NOTE — ED Triage Notes (Signed)
Patient reports having sciatica and chronic back pain.  Recently moved from [facility].  Has not been able to get a PCP in order to get oxycodone prescription refilled.

## 2018-10-21 NOTE — Discharge Instructions (Addendum)
Take your home medicines as prescribed.  I have given you a few tablets of oxycodone to take to get you to your follow-up appointment with your PCP. We cannot continue to refill any narcotic pain medicines in the emergency department.  I have also given you some information for pain management and primary care practices local to our area.  Follow-up with them for further evaluation and management of your back pain.  You may find it helpful to use a cane or a walker to help you get around.  Return to the emergency department if any concerning signs or symptoms develop such as fevers, weakness, incontinence, or passing out.

## 2018-11-14 ENCOUNTER — Encounter: Payer: Self-pay | Admitting: Family Medicine

## 2018-11-15 ENCOUNTER — Ambulatory Visit: Payer: Medicare Other | Admitting: Family Medicine

## 2018-11-15 ENCOUNTER — Other Ambulatory Visit: Payer: Self-pay

## 2018-11-15 NOTE — Progress Notes (Signed)
Patient initally wanted to establish care however, requested continuation of management of pain medication (oxycodone) and bipolar disorder medications as his prior PCP managed for him. Advised our office is primary care only and I would not be able to continue his pain medication. He would require pain management. He stated "we got a problem". I provided him with information to establish care with [facility] as they have a pain clinic within their practice. Apologized that I would be unable to meet his needs for pain management. He thank me for information. Call ended. No charge for encounter.

## 2018-11-15 NOTE — Progress Notes (Deleted)
Virtual Visit via Telephone Note  I connected with Maxwel Meadowcroft on 11/15/18 at  9:50 AM EDT by telephone and verified that I am speaking with the correct person using two identifiers.  Location: Patient: *** Provider: ***   I discussed the limitations, risks, security and privacy concerns of performing an evaluation and management service by telephone and the availability of in person appointments. I also discussed with the patient that there may be a patient responsible charge related to this service. The patient expressed understanding and agreed to proceed.   History of Present Illness:    Observations/Objective:   Assessment and Plan:   Follow Up Instructions:    I discussed the assessment and treatment plan with the patient. The patient was provided an opportunity to ask questions and all were answered. The patient agreed with the plan and demonstrated an understanding of the instructions.   The patient was advised to call back or seek an in-person evaluation if the symptoms worsen or if the condition fails to improve as anticipated.  I provided *** minutes of non-face-to-face time during this encounter.   Molli Barrows, FNP

## 2020-08-16 ENCOUNTER — Other Ambulatory Visit: Payer: Self-pay

## 2020-08-16 ENCOUNTER — Encounter: Payer: Self-pay | Admitting: Emergency Medicine

## 2020-08-16 ENCOUNTER — Ambulatory Visit
Admission: EM | Admit: 2020-08-16 | Discharge: 2020-08-16 | Disposition: A | Discharge status: Home / Self Care | Payer: Medicare Other

## 2020-08-16 DIAGNOSIS — R221 Localized swelling, mass and lump, neck: Secondary | ICD-10-CM

## 2020-08-16 NOTE — ED Provider Notes (Signed)
[facility]    CSN: [id number] Arrival date & time: 08/16/20  1621      History   Chief Complaint Chief Complaint  Patient presents with  . neck swelling    left    HPI [name] Gray is a 70 y.o. male presenting for concerns about a swollen area of the left side of his neck that is associated with headaches over the past 2 weeks.  Patient states that the area seems to get bigger and then smaller and he has intermittent headaches around this area.  He denies any fatigue, fevers, night sweats.  He has not had any ear pain, congestion or cough.  No painful or difficulty swallowing.  No other areas of swelling.  Patient see his PCP about this and they placed a referral to ENT but he has not heard yet.  He has a history of anxiety, bipolar disorder, chronic pain on chronic opioids, hepatitis C, and stage III CKD.  He is also a smoker.  He has no other concerns.  HPI  Past Medical History:  Diagnosis Date  . Anxiety   . Benign nodular prostatic hyperplasia with lower urinary tract symptoms   . Bipolar disorder (HCC)   . Chronic back pain   . Chronic recurrent major depressive disorder (HCC)   . Colon polyps   . Esophageal reflux   . Essential hypertension   . History of Barrett's esophagus   . Hx of hepatitis C   . Obesity   . Osteoarthritis   . Prostatitis   . Stage 3 chronic kidney disease [facility])     Patient Active Problem List   Diagnosis Date Noted  . BPH with obstruction/lower urinary tract symptoms 04/24/2015  . H/O urinary retention 04/24/2015  . 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 Benign prostatic hyperplasia with urinary obstruction 01/18/2014  . Abdominal pain, generalized 09/18/2013  . Back pain, chronic 07/24/2013  . Barrett esophagus 07/17/2013  . 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 Myofascial pain 06/14/2013  . Abnormal toxicological findings 09/23/2012  . Decreased potassium in the blood 07/18/2012  . Back ache 05/09/2012   . Callus of foot 05/09/2012  . Affective bipolar disorder (HCC) 04/14/2012  . Bipolar affective disorder (HCC) 04/14/2012  . Impaired renal function 04/16/2011  . Anxiety state 11/25/2010  . Clinical depression 11/25/2010  . Acid reflux 11/25/2010  . Benign essential HTN 11/25/2010  . Benign prostatic hypertrophy without urinary obstruction 11/25/2010  . Arthritis, degenerative 11/25/2010  . Compulsive tobacco user syndrome 11/25/2010  . Current tobacco use 11/25/2010    Past Surgical History:  Procedure Laterality Date  . APPENDECTOMY    . CHOLECYSTECTOMY    . TRIGGER FINGER RELEASE Left        Home Medications    Prior to Admission medications   Medication Sig Start Date End Date Taking? Authorizing Provider  FLUoxetine (PROZAC) 20 MG capsule Take 20 mg by mouth daily. 08/31/18  Yes [provider]  lamoTRIgine (LAMICTAL) 100 MG tablet Take 100 mg by mouth.   Yes [provider]  lisinopril (PRINIVIL,ZESTRIL) 40 MG tablet Take 40 mg by mouth.   Yes [provider]  metoprolol (LOPRESSOR) 50 MG tablet Take by mouth. 09/19/14  Yes [provider]  oxyCODONE (ROXICODONE) 5 MG immediate release tablet Take 1 tablet (5 mg total) by mouth every 12 (twelve) hours as needed for severe pain. 10/21/18  Yes Fawze, Mina A, PA-C  amLODipine (NORVASC) 10 MG tablet Take  1 tablet by mouth daily. 05/27/20   [provider]    Family History Family History  Problem Relation Age of Onset  . Hypertension Mother   . Cancer Mother   . Diabetes Father   . Mental illness Father   . Heart attack Father   . Stroke Father   . Cancer Maternal Grandmother   . Cancer Maternal Grandfather   . Kidney disease Neg Hx   . Prostate cancer Neg Hx     Social History Social History   Tobacco Use  . Smoking status: Current Every Day Smoker    Types: Cigarettes  . Smokeless tobacco: Never Used  Vaping Use  . Vaping Use: Never used  Substance Use Topics   . Alcohol use: No    Alcohol/week: 0.0 standard drinks  . Drug use: No     Allergies   Latex; Antihistamines, chlorpheniramine-type; and Tramadol   Review of Systems Review of Systems  Constitutional: Negative for activity change, appetite change, fatigue, fever and unexpected weight change.  HENT: Negative for congestion, ear pain, facial swelling, rhinorrhea, sinus pain and sore throat.        Swollen lump of neck  Respiratory: Negative for cough and shortness of breath.   Gastrointestinal: Negative for nausea and vomiting.  Musculoskeletal: Negative for neck pain and neck stiffness.  Skin: Negative for rash and wound.  Neurological: Positive for headaches. Negative for dizziness and weakness.     Physical Exam Triage Vital Signs ED Triage Vitals  Enc Vitals Group     BP 08/16/20 1642 139/87     Pulse Rate 08/16/20 1642 80     Resp 08/16/20 1642 18     Temp 08/16/20 1642 99.1 F (37.3 C)     Temp Source 08/16/20 1642 Oral     SpO2 08/16/20 1642 100 %     Weight 08/16/20 1639 190 lb 0.6 oz (86.2 kg)     Height 08/16/20 1639 5\' 9"  (1.753 m)     Head Circumference --      Peak Flow --      Pain Score 08/16/20 1639 8     Pain Loc --      Pain Edu? --      Excl. in GC? --    No data found.  Updated Vital Signs BP 139/87 (BP Location: Left Arm)   Pulse 80   Temp 99.1 F (37.3 C) (Oral)   Resp 18   Ht 5\' 9"  (1.753 m)   Wt 190 lb 0.6 oz (86.2 kg)   SpO2 100%   BMI 28.06 kg/m      Physical Exam Vitals and nursing note reviewed.  Constitutional:      General: He is not in acute distress.    Appearance: Normal appearance. He is well-developed. He is not ill-appearing or diaphoretic.  HENT:     Head: Normocephalic and atraumatic.     Right Ear: Tympanic membrane, ear canal and external ear normal.     Left Ear: Tympanic membrane, ear canal and external ear normal.     Nose: Nose normal.     Mouth/Throat:     Mouth: Mucous membranes are moist.      Pharynx: Oropharynx is clear.  Eyes:     General: No scleral icterus.    Conjunctiva/sclera: Conjunctivae normal.  Neck:     Comments: There is a small mass of the posterior auricular region that is mildly TTP Cardiovascular:     Rate and Rhythm:  Normal rate and regular rhythm.     Heart sounds: Normal heart sounds.  Pulmonary:     Effort: Pulmonary effort is normal. No respiratory distress.     Breath sounds: Normal breath sounds.  Musculoskeletal:     Cervical back: Neck supple.  Skin:    General: Skin is warm and dry.  Neurological:     General: No focal deficit present.     Mental Status: He is alert. Mental status is at baseline.     Motor: No weakness.     Gait: Gait normal.  Psychiatric:        Mood and Affect: Mood normal.        Behavior: Behavior normal.        Thought Content: Thought content normal.      UC Treatments / Results  Labs (all labs ordered are listed, but only abnormal results are displayed) Labs Reviewed - No data to display  EKG   Radiology No results found.  Procedures Procedures (including critical care time)  Medications Ordered in UC Medications - No data to display  Initial Impression / Assessment and Plan / UC Course  I have reviewed the triage vital signs and the nursing notes.  Pertinent labs & imaging results that were available during my care of the patient were reviewed by me and considered in my medical decision making (see chart for details).   70 year old male presenting for left neck mass.  He says is been causing him more discomfort over the past 2 weeks.  He did see his PCP about a week and a half ago for this and I was able to review the note.  He was given a referral to ENT specialist in [facility].  Patient has not heard from the specialist.  Suspect cyst vs lymphadenopathy or other mass  I did offer lab work to be performed today but advised him that ultimately he will need to follow-up with ENT as scheduled  through the referral.  Patient declines any labs today and states that he will does take the information for the ENT specialist and call him next week.  At this time, advised him to ice the area and take 250 mg of Aleve once a day or 200 mg of ibuprofen 1-2 times daily for the discomfort and continue his home pain medicine.  ED precautions reviewed.  Final Clinical Impressions(s) / UC Diagnoses   Final diagnoses:  Neck mass     Discharge Instructions     Your PCP referred you to [facility] in [facility]. Since it has been over 1.5 weeks, contact them on Monday. You will need to follow up with ENT for this. You may need an ultrasound of the mass. At this time, take your home pain medicine if needed as well as Aleve and apply ice to the area.   [facility]   [address] [place], [place] [address]   Phone: [phone]   Fax: [phone]      ED Prescriptions    None     I have reviewed the PDMP during this encounter.   Shirlee Latch, PA-C 08/16/20 1746

## 2020-08-16 NOTE — ED Triage Notes (Signed)
Pt c/o left sided neck swelling and headache. Started 2 weeks ago.

## 2020-08-16 NOTE — Discharge Instructions (Addendum)
Your PCP referred you to [facility]NT in [place]. Since it has been over 1.5 weeks, contact them on Monday. You will need to follow up with ENT for this. You may need an ultrasound of the mass. At this time, take your home pain medicine if needed as well as Aleve and apply ice to the area.   [facility]   [address] [place], [place] [address]   Phone: [phone]   Fax: [phone]

## 2021-07-18 ENCOUNTER — Other Ambulatory Visit: Payer: Self-pay

## 2021-07-18 ENCOUNTER — Emergency Department: Payer: Medicare Other

## 2021-07-18 ENCOUNTER — Emergency Department
Admission: EM | Admit: 2021-07-18 | Discharge: 2021-07-18 | Disposition: A | Discharge status: Home / Self Care | Payer: Medicare Other | Attending: Emergency Medicine | Admitting: Emergency Medicine

## 2021-07-18 DIAGNOSIS — R1903 Right lower quadrant abdominal swelling, mass and lump: Secondary | ICD-10-CM | POA: Diagnosis present

## 2021-07-18 DIAGNOSIS — I129 Hypertensive chronic kidney disease with stage 1 through stage 4 chronic kidney disease, or unspecified chronic kidney disease: Secondary | ICD-10-CM | POA: Diagnosis not present

## 2021-07-18 DIAGNOSIS — N189 Chronic kidney disease, unspecified: Secondary | ICD-10-CM | POA: Insufficient documentation

## 2021-07-18 DIAGNOSIS — K4090 Unilateral inguinal hernia, without obstruction or gangrene, not specified as recurrent: Secondary | ICD-10-CM | POA: Insufficient documentation

## 2021-07-18 LAB — CBC
HCT: 36.1 % — ABNORMAL LOW (ref 39.0–52.0)
Hemoglobin: 12 g/dL — ABNORMAL LOW (ref 13.0–17.0)
MCH: 29.2 pg (ref 26.0–34.0)
MCHC: 33.2 g/dL (ref 30.0–36.0)
MCV: 87.8 fL (ref 80.0–100.0)
Platelets: 228 10*3/uL (ref 150–400)
RBC: 4.11 MIL/uL — ABNORMAL LOW (ref 4.22–5.81)
RDW: 14.1 % (ref 11.5–15.5)
WBC: 7.2 10*3/uL (ref 4.0–10.5)
nRBC: 0 % (ref 0.0–0.2)

## 2021-07-18 LAB — COMPREHENSIVE METABOLIC PANEL
ALT: 14 U/L (ref 0–44)
AST: 16 U/L (ref 15–41)
Albumin: 3.6 g/dL (ref 3.5–5.0)
Alkaline Phosphatase: 88 U/L (ref 38–126)
Anion gap: 9 (ref 5–15)
BUN: 16 mg/dL (ref 8–23)
CO2: 23 mmol/L (ref 22–32)
Calcium: 9 mg/dL (ref 8.9–10.3)
Chloride: 102 mmol/L (ref 98–111)
Creatinine, Ser: 2.17 mg/dL — ABNORMAL HIGH (ref 0.61–1.24)
GFR, Estimated: 32 mL/min — ABNORMAL LOW (ref >60)
Glucose, Bld: 106 mg/dL — ABNORMAL HIGH (ref 70–99)
Potassium: 3.8 mmol/L (ref 3.5–5.1)
Sodium: 134 mmol/L — ABNORMAL LOW (ref 135–145)
Total Bilirubin: 0.3 mg/dL (ref 0.3–1.2)
Total Protein: 8 g/dL (ref 6.5–8.1)

## 2021-07-18 LAB — LIPASE, BLOOD: Lipase: 41 U/L (ref 11–51)

## 2021-07-18 MED ORDER — HERNIA SUPPORT RIGHT LARGE MISC: 1.0000 [IU] | 0 refills | Status: AC

## 2021-07-18 MED ORDER — ONDANSETRON HCL 4 MG/2ML IJ SOLN
4.0000 mg | Freq: Once | INTRAMUSCULAR | Status: AC
  Administered 2021-07-18: 4 mg via INTRAVENOUS
  Filled 2021-07-18: qty 2

## 2021-07-18 MED ORDER — OXYCODONE-ACETAMINOPHEN 5-325 MG PO TABS
1.0000 | ORAL_TABLET | ORAL | Status: DC | PRN
  Administered 2021-07-18: 1 via ORAL
  Filled 2021-07-18: qty 1

## 2021-07-18 MED ORDER — MORPHINE SULFATE (PF) 4 MG/ML IV SOLN
4.0000 mg | Freq: Once | INTRAVENOUS | Status: AC
  Administered 2021-07-18: 4 mg via INTRAVENOUS
  Filled 2021-07-18: qty 1

## 2021-07-18 MED ORDER — OXYCODONE-ACETAMINOPHEN 5-325 MG PO TABS
1.0000 | ORAL_TABLET | Freq: Once | ORAL | Status: DC
Start: 2021-07-18 — End: 2021-07-18

## 2021-07-18 MED ORDER — HYDROMORPHONE HCL 1 MG/ML IJ SOLN
0.5000 mg | Freq: Once | INTRAMUSCULAR | Status: AC
  Administered 2021-07-18: 0.5 mg via INTRAVENOUS
  Filled 2021-07-18: qty 1

## 2021-07-18 MED ORDER — OXYCODONE-ACETAMINOPHEN 5-325 MG PO TABS: 2.0000 | ORAL_TABLET | Freq: Once | ORAL | Status: DC

## 2021-07-18 NOTE — ED Notes (Signed)
Patient transported to CT 

## 2021-07-18 NOTE — ED Notes (Signed)
Pt presents to ED with c/o hernia on the right side of groin area. Pt states that it has been there for around a month ago and the pain has increased and that is why he came today.  ?

## 2021-07-18 NOTE — Discharge Instructions (Addendum)
We have given you a prescription for a support for the hernia. ? ?Make an appointment to follow-up with the general surgeon as you may need surgery if the hernia keeps causing symptoms. ? ?Return to the ER immediately for new, worsening, or persistent severe pain, persistent swelling of the hernia with it not being able to go back in, nausea or vomiting, fever, not passing gas, or any other new or worsening symptoms that concern you. ? ?You may continue to take your normal chronic pain medication as prescribed. ?

## 2021-07-18 NOTE — ED Notes (Signed)
Pt verbalized understanding of discharge instructions and follow-up care instructions. Pt advised if symptoms worsen to return to ED. E-signature not available due to e-signature pad not being available.   

## 2021-07-18 NOTE — ED Provider Notes (Signed)
? ?[facility] ?Provider Note ? ? ? Event Date/Time  ? First MD Initiated Contact with Patient 07/18/21 1900   ?  (approximate) ? ? ?History  ? ?Hernia ? ? ?HPI ? ?[name] Gray is a 71 y.o. male with history of chronic back pain, chronic kidney disease, hypertension, and bipolar disorder who presents with a right inguinal mass which has been present for the last month and pops out intermittently.  He states that it comes out when he is up on his feet or walking around a lot.  It is painful when it does so.  Usually goes back down.  Today he states that he has been walking around and the bump has been out all day and not gone back down.  It is more painful than usual.  He states that his family pushed him to finally get it checked out today.  He denies any nausea or vomiting, constipation or obstipation, fever, or other acute symptoms. ? ? ? ?Physical Exam  ? ?Triage Vital Signs: ?ED Triage Vitals  ?Enc Vitals Group  ?   BP 07/18/21 1812 115/77  ?   Pulse Rate 07/18/21 1812 66  ?   Resp 07/18/21 1812 16  ?   Temp 07/18/21 1812 98.3 ?F (36.8 ?C)  ?   Temp Source 07/18/21 1812 Oral  ?   SpO2 07/18/21 1812 100 %  ?   Weight 07/18/21 1805 180 lb (81.6 kg)  ?   Height 07/18/21 1805 5\' 8"  (1.727 m)  ?   Head Circumference --   ?   Peak Flow --   ?   Pain Score 07/18/21 1805 8  ?   Pain Loc --   ?   Pain Edu? --   ?   Excl. in GC? --   ? ? ?Most recent vital signs: ?Vitals:  ? 07/18/21 2027 07/18/21 2316  ?BP: 126/87 139/80  ?Pulse: 60 64  ?Resp: 16 16  ?Temp:  97.8 ?F (36.6 ?C)  ?SpO2: 100% 97%  ? ? ? ?General: Awake, no distress.  ?CV:  Good peripheral perfusion.  ?Resp:  Normal effort.  ?Abd:  Soft and nontender.  No distention.  ?Other:  Palpable right inguinal hernia, nonreducible initially. ? ? ?ED Results / Procedures / Treatments  ? ?Labs ?(all labs ordered are listed, but only abnormal results are displayed) ?Labs Reviewed  ?COMPREHENSIVE METABOLIC PANEL - Abnormal; Notable for the  following components:  ?    Result Value  ? Sodium 134 (*)   ? Glucose, Bld 106 (*)   ? Creatinine, Ser 2.17 (*)   ? GFR, Estimated 32 (*)   ? All other components within normal limits  ?CBC - Abnormal; Notable for the following components:  ? RBC 4.11 (*)   ? Hemoglobin 12.0 (*)   ? HCT 36.1 (*)   ? All other components within normal limits  ?LIPASE, BLOOD  ? ? ? ?EKG ? ? ? ? ?RADIOLOGY ? ?CT abdomen/pelvis: I independently viewed and interpreted the images; there are no dilated bowel loops or evidence of obstruction. ? ?Radiology report indicates hernia with no evidence of acute complication. ? ?Moderate right inguinal hernia, new since prior examination,  ?containing a single unremarkable loop of distal small bowel. No  ?evidence of obstruction or perforation.  ?   ?Severe distal colonic diverticulosis without superimposed acute  ?inflammatory change.  ?   ?Mild bibasilar pulmonary infiltrate, possibly infectious or  ?inflammatory in nature. Correlation with  clinical history is  ?recommended. If indicated, this may be better assessed with  ?nonemergent high-resolution CT examination.  ?   ?Emphysema (ICD10-J43.9).  ? ? ? ?PROCEDURES: ? ?Critical Care performed: No ? ?Procedures ? ? ?MEDICATIONS ORDERED IN ED: ?Medications  ?oxyCODONE-acetaminophen (PERCOCET/ROXICET) 5-325 MG per tablet 2 tablet (2 tablets Oral Patient Refused/Not Given 07/18/21 2318)  ?ondansetron [facility]) injection 4 mg (4 mg Intravenous Given 07/18/21 2021)  ?morphine (PF) 4 MG/ML injection 4 mg (4 mg Intravenous Given 07/18/21 2023)  ?HYDROmorphone (DILAUDID) injection 0.5 mg (0.5 mg Intravenous Given 07/18/21 2145)  ? ? ? ?IMPRESSION / MDM / ASSESSMENT AND PLAN / ED COURSE  ?I reviewed the triage vital signs and the nursing notes. ? ?71 year old male with PMH as noted above presents with a painful mass in his right inguinal area which has been intermittent over the last month.  Exam is consistent with an inguinal hernia.  Initially when I  examined the patient I was not able to reduce the hernia due to pain.  We will obtain a CT although my clinical suspicion for strangulation is low.  We will give additional pain medication and attempt to reduce the hernia. ? ?----------------------------------------- ?12:11 AM on 07/19/2021 ?----------------------------------------- ? ?CT shows an uncomplicated hernia.  Additional pain medication was given and then I was able to easily reduce the hernia, with resolution of the patient's pain.  We applied an Ace bandage around his lower abdomen as a temporary girdle and I have prescribed him for a hernia support. ? ?CT also showed possible lower lung infiltrates, however the patient has no cough, fever, shortness of breath, leukocytosis, or other findings to suggest pneumonia or other acute lung issue. ? ?At this time, there is no evidence of strangulation or incarceration, and the patient is stable for discharge home.  I gave him thorough return precautions and he expressed understanding.  I have made a referral to general surgery. ? ? ?FINAL CLINICAL IMPRESSION(S) / ED DIAGNOSES  ? ?Final diagnoses:  ?Unilateral inguinal hernia without obstruction or gangrene, recurrence not specified  ? ? ? ?Rx / DC Orders  ? ?ED Discharge Orders   ? ?      Ordered  ?  Elastic Bandages & Supports (HERNIA SUPPORT RIGHT LARGE) MISC  As directed       ? 07/18/21 2224  ? ?  ?  ? ?  ? ? ? ?Note:  This document was prepared using Dragon voice recognition software and may include unintentional dictation errors.  ?  Dionne Bucy, MD ?07/19/21 0013 ? ?

## 2021-07-18 NOTE — ED Triage Notes (Signed)
Patient to ER via POV with right sided hernia. Reports the hernia appeared approximately one month ago, it has increased in size and is painful. Pain worse when walking. States he had one has a child but hasn't had an issue until now.  ?

## 2021-07-31 ENCOUNTER — Ambulatory Visit: Payer: Medicaid Other | Admitting: Surgery

## 2021-08-06 ENCOUNTER — Ambulatory Visit: Payer: Medicare Other | Admitting: Surgery

## 2021-08-20 ENCOUNTER — Ambulatory Visit: Payer: Medicare Other | Admitting: Surgery

## 2021-10-07 ENCOUNTER — Ambulatory Visit
Admission: EM | Admit: 2021-10-07 | Discharge: 2021-10-07 | Disposition: A | Discharge status: Home / Self Care | Payer: Medicare Other | Attending: Physician Assistant | Admitting: Physician Assistant

## 2021-10-07 DIAGNOSIS — T783XXA Angioneurotic edema, initial encounter: Secondary | ICD-10-CM

## 2021-10-07 DIAGNOSIS — R220 Localized swelling, mass and lump, head: Secondary | ICD-10-CM

## 2021-10-07 MED ORDER — EPINEPHRINE 0.3 MG/0.3ML IJ SOAJ: 0.3000 mg | INTRAMUSCULAR | 0 refills | Status: AC | PRN

## 2021-10-07 MED ORDER — FAMOTIDINE 20 MG PO TABS: 20.0000 mg | ORAL_TABLET | Freq: Two times a day (BID) | ORAL | 0 refills | Status: AC

## 2021-10-07 MED ORDER — FAMOTIDINE 20 MG PO TABS
20.0000 mg | ORAL_TABLET | Freq: Once | ORAL | Status: AC
  Administered 2021-10-07: 20 mg via ORAL

## 2021-10-07 NOTE — Discharge Instructions (Addendum)
Patient care time 22 minutes.  This includes both face-to-face and non-face-to-face time.-STOP taking lisinopril -Take Pepcid, 1 tablet twice a day for 5 days -Contact primary care provider for adjustments on your blood pressure medication -Would report to the ER should swelling acutely worsen causing shortness of breath, difficulty swallowing, difficulty talking

## 2021-10-07 NOTE — ED Provider Notes (Signed)
[facility]    CSN: [id number] Arrival date & time: 10/07/21  1847      History   Chief Complaint Chief Complaint  Patient presents with   Mouth Problem    HPI this is [name] Gray is a 71 y.o. male.   patient is a 71 year old male who presents with chief complaint of left-sided facial swelling along the jaw and cheek as well as a feeling of lumps along his cheek.  He called his primary care presented to be evaluated urgent care as patient's blood pressure medication can cause facial swelling.  He reports his swelling comes and goes and is down lower than it was before.     Past Medical History:  Diagnosis Date   Anxiety    Benign nodular prostatic hyperplasia with lower urinary tract symptoms    Bipolar disorder (HCC)    Chronic back pain    Chronic recurrent major depressive disorder (HCC)    Colon polyps    Esophageal reflux    Essential hypertension    History of Barrett's esophagus    Hx of hepatitis C    Obesity    Osteoarthritis    Prostatitis    Stage 3 chronic kidney disease ([place])     Patient Active Problem List   Diagnosis Date Noted   BPH with obstruction/lower urinary tract symptoms 04/24/2015   H/O urinary retention 04/24/2015   HCV antibody positive 03/21/2014   Benign prostatic hyperplasia with urinary obstruction 01/18/2014   Abdominal pain, generalized 09/18/2013   Back pain, chronic 07/24/2013   Barrett esophagus 07/17/2013   Degenerative arthritis of lumbar spine 06/14/2013   Myofascial pain 06/14/2013   Abnormal toxicological findings 09/23/2012   Decreased potassium in the blood 07/18/2012   Back ache 05/09/2012   Callus of foot 05/09/2012   Affective bipolar disorder ([place]) 04/14/2012   Bipolar affective disorder ([place]) 04/14/2012   Impaired renal function 04/16/2011   Anxiety state 11/25/2010   Clinical depression 11/25/2010   Acid reflux 11/25/2010   Benign essential HTN 11/25/2010   Benign prostatic hypertrophy  without urinary obstruction 11/25/2010   Arthritis, degenerative 11/25/2010   Compulsive tobacco user syndrome 11/25/2010   Current tobacco use 11/25/2010    Past Surgical History:  Procedure Laterality Date   APPENDECTOMY     CHOLECYSTECTOMY     TRIGGER FINGER RELEASE Left        Home Medications    Prior to Admission medications   Medication Sig Start Date End Date Taking? Authorizing Provider  famotidine (PEPCID) 20 MG tablet Take 1 tablet (20 mg total) by mouth 2 (two) times daily for 5 days. 10/07/21 10/12/21 Yes Luvenia Redden, PA-C  amLODipine (NORVASC) 10 MG tablet Take 1 tablet by mouth daily. 05/27/20   [provider]  Elastic Bandages & Supports (HERNIA SUPPORT RIGHT LARGE) MISC 1 Units by Does not apply route as directed. 07/18/21   Arta Silence, MD  FLUoxetine (PROZAC) 20 MG capsule Take 20 mg by mouth daily. 08/31/18   [provider]  lamoTRIgine (LAMICTAL) 100 MG tablet Take 100 mg by mouth.    [provider]  lisinopril (PRINIVIL,ZESTRIL) 40 MG tablet Take 40 mg by mouth.    [provider]  metoprolol (LOPRESSOR) 50 MG tablet Take by mouth. 09/19/14   [provider]  oxyCODONE (ROXICODONE) 5 MG immediate release tablet Take 1 tablet (5 mg total) by mouth every 12 (twelve) hours as needed for severe pain. 10/21/18   Nils Flack,  Marlynn PerkingMina A, PA-C    Family History Family History  Problem Relation Age of Onset   Hypertension Mother    Cancer Mother    Diabetes Father    Mental illness Father    Heart attack Father    Stroke Father    Cancer Maternal Grandmother    Cancer Maternal Grandfather    Kidney disease Neg Hx    Prostate cancer Neg Hx     Social History Social History   Tobacco Use   Smoking status: Every Day    Types: Cigarettes   Smokeless tobacco: Never  Vaping Use   Vaping Use: Never used  Substance Use Topics   Alcohol use: Yes   Drug use: No     Allergies   Latex; Antihistamines,  chlorpheniramine-type; and Tramadol   Review of Systems Review of Systems as above in HPI.  Other systems reviewed and found to be negative   Physical Exam Triage Vital Signs ED Triage Vitals  Enc Vitals Group     BP 10/07/21 1906 (!) 151/94     Pulse Rate 10/07/21 1906 71     Resp 10/07/21 1906 18     Temp 10/07/21 1906 98.9 F (37.2 C)     Temp Source 10/07/21 1906 Oral     SpO2 10/07/21 1906 95 %     Weight 10/07/21 1903 172 lb (78 kg)     Height 10/07/21 1903 5\' 7"  (1.702 m)     Head Circumference --      Peak Flow --      Pain Score 10/07/21 1903 0     Pain Loc --      Pain Edu? --      Excl. in GC? --    No data found.  Updated Vital Signs BP (!) 151/94 (BP Location: Left Arm)   Pulse 71   Temp 98.9 F (37.2 C) (Oral)   Resp 18   Ht 5\' 7"  (1.702 m)   Wt 172 lb (78 kg)   SpO2 95%   BMI 26.94 kg/m    Physical Exam HENT:     Head:     Comments: Swelling noted to left side of upper lip. No difficulty swallowing, talking or breathing. Minimal swelling of L cheek compared to right side.  Cardiovascular:     Rate and Rhythm: Normal rate.  Pulmonary:     Effort: Pulmonary effort is normal. No respiratory distress.  Neurological:     General: No focal deficit present.     Mental Status: He is alert and oriented to person, place, and time.     UC Treatments / Results  Labs (all labs ordered are listed, but only abnormal results are displayed) Labs Reviewed - No data to display  EKG   Radiology No results found.  Procedures Procedures (including critical care time)  Medications Ordered in UC Medications  famotidine (PEPCID) tablet 20 mg (20 mg Oral Given 10/07/21 1932)    Initial Impression / Assessment and Plan / UC Course  I have reviewed the triage vital signs and the nursing notes.  Pertinent labs & imaging results that were available during my care of the patient were reviewed by me and considered in my medical decision making (see chart  for details).    Patient presents with complaint of acute swelling left side of face that noticed this afternoon when he woke up from a nap. He reports swelling is better than it was. Given use of lisinopril, angioedema is  a concern even though the swelling is mostly to left side of face. Have him take Pepcid twice a day for 5 days and follow up with PCP. Final Clinical Impressions(s) / UC Diagnoses   Final diagnoses:  Facial swelling  Angioedema, initial encounter     Discharge Instructions      -STOP taking lisinopril -Take Pepcid, 1 tablet twice a day for 5 days -Contact primary care provider for adjustments on your blood pressure medication -Would report to the ER should swelling acutely worsen causing shortness of breath, difficulty swallowing, difficulty talking      ED Prescriptions     Medication Sig Dispense Auth. Provider   famotidine (PEPCID) 20 MG tablet Take 1 tablet (20 mg total) by mouth 2 (two) times daily for 5 days. 10 tablet Luvenia Redden, PA-C      PDMP not reviewed this encounter.  Patient care time of 22 minutes including both face to face and non face to face time.    Luvenia Redden, PA-C 10/07/21 1935

## 2021-10-07 NOTE — ED Triage Notes (Signed)
Pt c/o left side facial swelling along the jaw and cheek.  Pt states that he has lumps along his cheek.   Pt called his doctor and was told it could be caused by his blood pressure medication and he should be evaluated at the urgent care.    Pt states that the swelling comes and goes and that it is down lower than it was before.

## 2022-12-02 ENCOUNTER — Other Ambulatory Visit: Payer: Self-pay | Admitting: Nephrology

## 2022-12-02 DIAGNOSIS — N400 Enlarged prostate without lower urinary tract symptoms: Secondary | ICD-10-CM

## 2022-12-02 DIAGNOSIS — N184 Chronic kidney disease, stage 4 (severe): Secondary | ICD-10-CM

## 2022-12-02 DIAGNOSIS — N2581 Secondary hyperparathyroidism of renal origin: Secondary | ICD-10-CM

## 2022-12-15 ENCOUNTER — Other Ambulatory Visit: Payer: Medicare Other

## 2022-12-17 ENCOUNTER — Other Ambulatory Visit: Payer: 59

## 2023-01-08 ENCOUNTER — Other Ambulatory Visit: Payer: 59

## 2023-01-12 ENCOUNTER — Other Ambulatory Visit: Payer: 59

## 2023-01-19 ENCOUNTER — Ambulatory Visit
Admission: RE | Admit: 2023-01-19 | Discharge: 2023-01-19 | Disposition: A | Discharge status: Home / Self Care | Payer: 59 | Source: Ambulatory Visit | Attending: Nephrology | Admitting: Nephrology

## 2023-01-19 DIAGNOSIS — N400 Enlarged prostate without lower urinary tract symptoms: Secondary | ICD-10-CM

## 2023-01-19 DIAGNOSIS — N2581 Secondary hyperparathyroidism of renal origin: Secondary | ICD-10-CM

## 2023-01-19 DIAGNOSIS — N184 Chronic kidney disease, stage 4 (severe): Secondary | ICD-10-CM

## 2023-05-22 IMAGING — CT CT ABD-PELV W/O CM
2 of 4 series · 16 of 46 positions shown, 18 images · non-contrast
Comparison: 10/29/2011, 01/13/2016

CLINICAL DATA: Hernia, complicated. Enlarging, painful right
abdominal wall hernia.



[Series 2: routine abd/pel wo · axial · 0.82mm/px · z∈[-405,+20]mm · 13 of 93 slices shown, 15 images]
[im 4/93  soft-tissue]
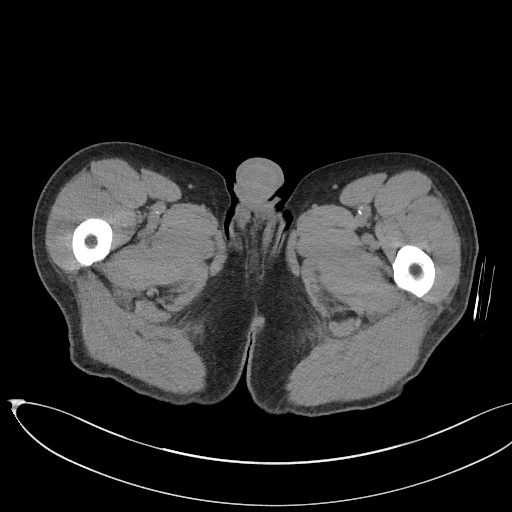
[im 4/93  bone]
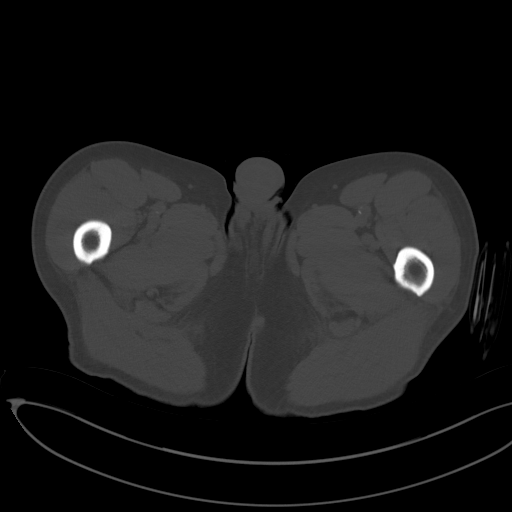
[im 12/93  soft-tissue]
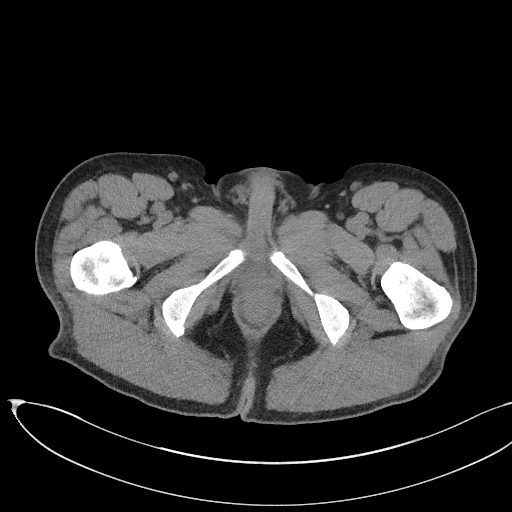
[im 20/93  soft-tissue]
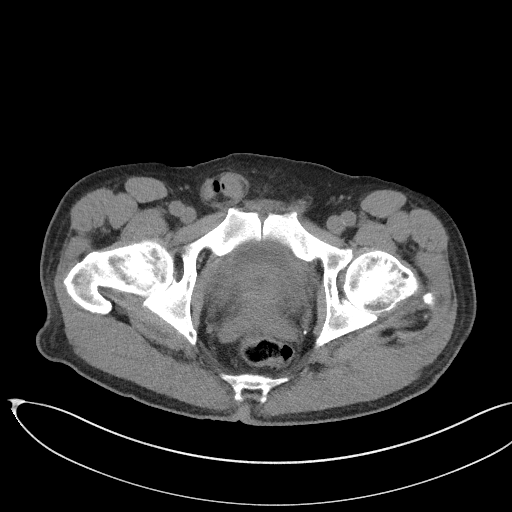
[im 27/93  soft-tissue]
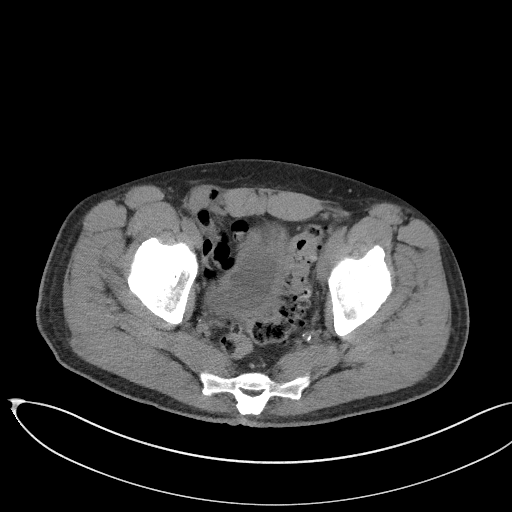
[im 31/93  soft-tissue]
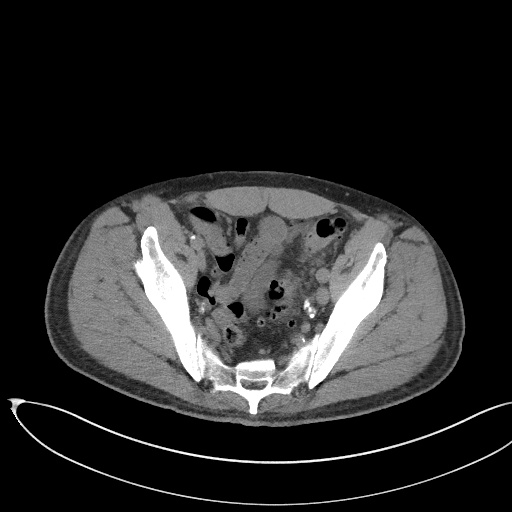
[im 39/93  soft-tissue]
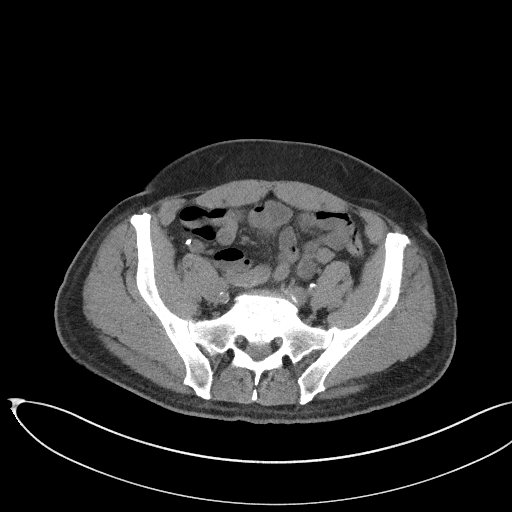
[im 47/93  soft-tissue]
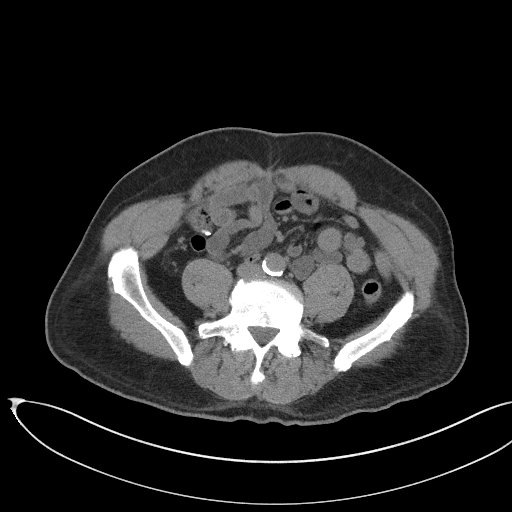
[im 54/93  soft-tissue]
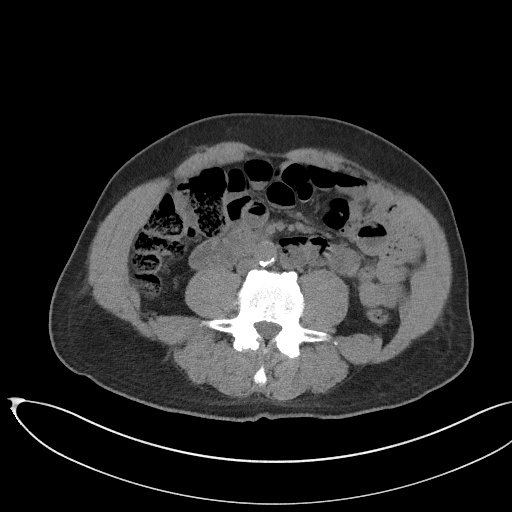
[im 62/93  soft-tissue]
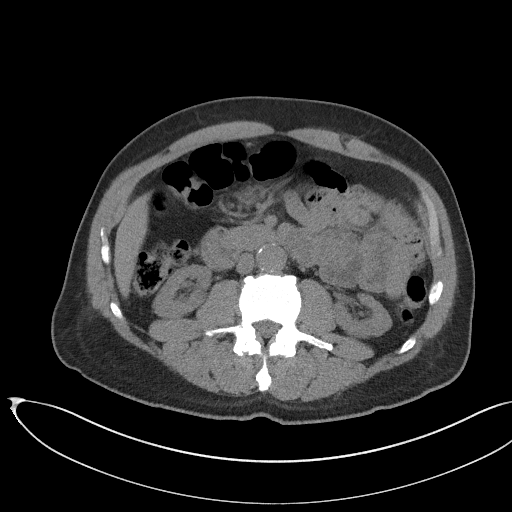
[im 62/93  bone]
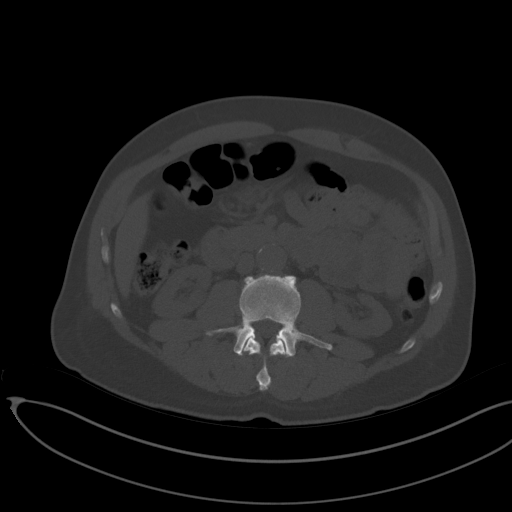
[im 66/93  soft-tissue]
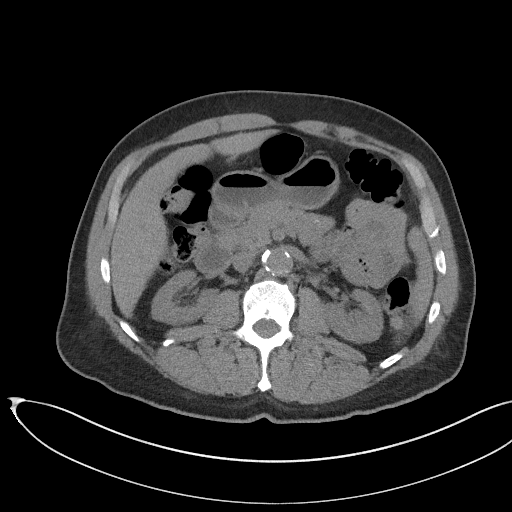
[im 73/93  soft-tissue]
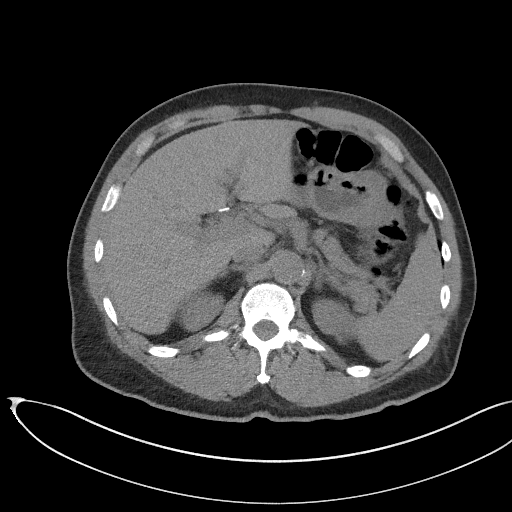
[im 81/93  soft-tissue]
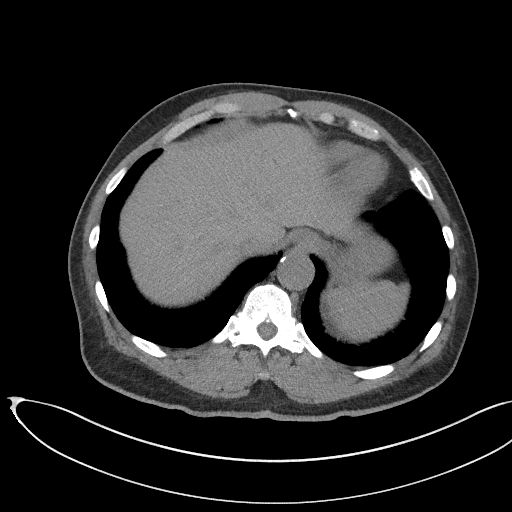
[im 89/93  soft-tissue]
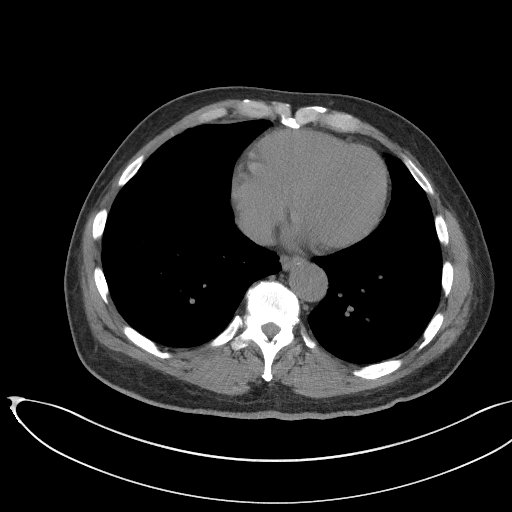

[Series 5: coronal st · coronal · 0.75mm/px · 3 of 96 slices shown]
[im 32/96  soft-tissue]
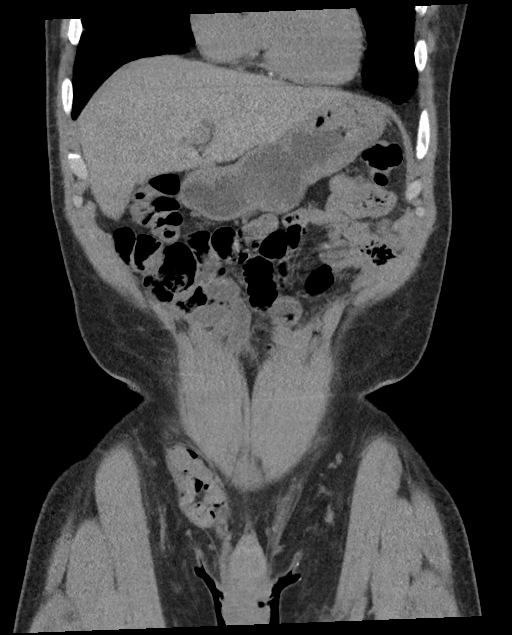
[im 43/96  soft-tissue]
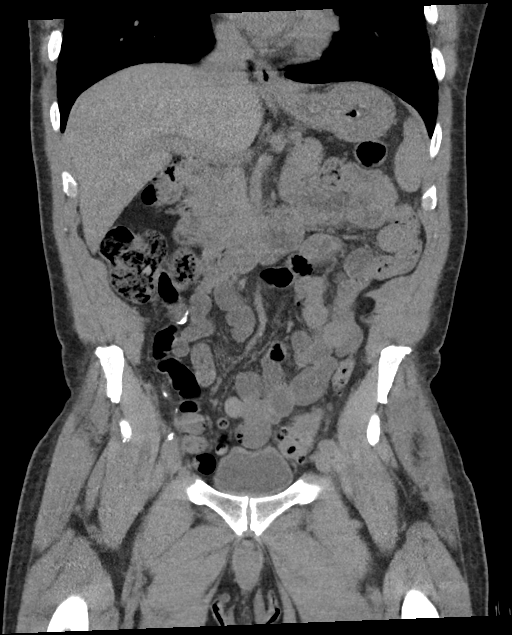
[im 53/96  soft-tissue]
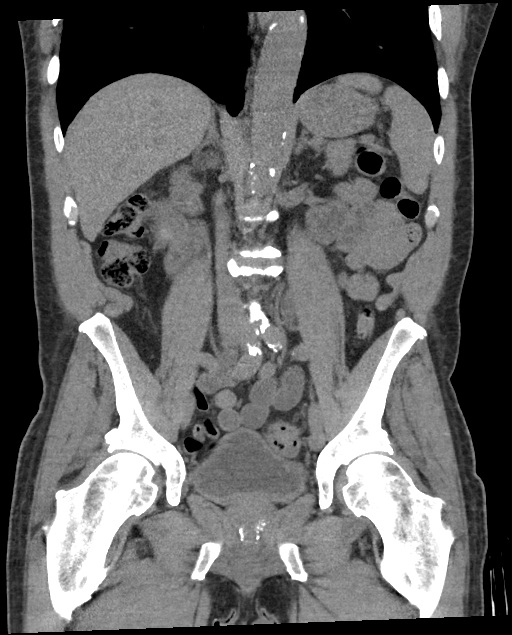

[16 of 46 positions shown; findings below may reference images not displayed]

FINDINGS: Lower chest: Mild infiltrate within the visualized basilar right
middle lobe and lingula and, to a lesser extent, within the
subpleural lower lobes bilaterally is nonspecific, possibly
infectious or inflammatory in nature. This appears new since prior
examination. Cardiac size within normal limits.

Hepatobiliary: No focal liver abnormality is seen. Status post
cholecystectomy. No biliary dilatation.

Pancreas: Unremarkable

Spleen: Unremarkable

Adrenals/Urinary Tract: Adrenal glands are unremarkable. Kidneys are
normal, without renal calculi, focal lesion, or hydronephrosis.
Bladder is unremarkable.

Stomach/Bowel: Moderate right inguinal hernia contains a single loop
of unremarkable distal small bowel. No evidence of obstruction. The
stomach and small bowel are otherwise unremarkable. Appendectomy has
been performed. Severe sigmoid diverticulosis without superimposed
acute inflammatory change. Large bowel otherwise unremarkable. No
free intraperitoneal gas or fluid.

Vascular/Lymphatic: Aortic atherosclerosis. No enlarged abdominal or
pelvic lymph nodes.

Reproductive: Prostate is unremarkable.

Other: None

Musculoskeletal: No acute bone abnormality. No lytic or blastic bone
lesion.
IMPRESSION: Moderate right inguinal hernia, new since prior examination,
containing a single unremarkable loop of distal small bowel. No
evidence of obstruction or perforation.

Severe distal colonic diverticulosis without superimposed acute
inflammatory change.

Mild bibasilar pulmonary infiltrate, possibly infectious or
inflammatory in nature. Correlation with clinical history is
recommended. If indicated, this may be better assessed with
nonemergent high-resolution CT examination.

Emphysema ([id number]).
